# Patient Record
Sex: Male | Born: 1960
Health system: Southern US, Community
[De-identification: ages and names within clinical notes are randomized; demographics above are authoritative.]

## PROBLEM LIST (undated history)

## (undated) DIAGNOSIS — K219 Gastro-esophageal reflux disease without esophagitis: Secondary | ICD-10-CM

## (undated) DIAGNOSIS — E785 Hyperlipidemia, unspecified: Secondary | ICD-10-CM

## (undated) DIAGNOSIS — I10 Essential (primary) hypertension: Secondary | ICD-10-CM

## (undated) DIAGNOSIS — T7840XA Allergy, unspecified, initial encounter: Secondary | ICD-10-CM

## (undated) HISTORY — DX: Essential (primary) hypertension: I10

## (undated) HISTORY — DX: Allergy, unspecified, initial encounter: T78.40XA

## (undated) HISTORY — DX: Gastro-esophageal reflux disease without esophagitis: K21.9

## (undated) HISTORY — DX: Hyperlipidemia, unspecified: E78.5

## (undated) HISTORY — PX: VASECTOMY: SHX75

---

## 2001-07-08 ENCOUNTER — Emergency Department (HOSPITAL_COMMUNITY): Admission: EM | Admit: 2001-07-08 | Discharge: 2001-07-08 | Payer: Self-pay | Admitting: Podiatry

## 2004-11-26 ENCOUNTER — Ambulatory Visit (HOSPITAL_COMMUNITY): Admission: RE | Admit: 2004-11-26 | Discharge: 2004-11-26 | Payer: Self-pay | Admitting: *Deleted

## 2011-06-01 HISTORY — PX: SHOULDER SURGERY: SHX246

## 2011-06-09 ENCOUNTER — Ambulatory Visit (INDEPENDENT_AMBULATORY_CARE_PROVIDER_SITE_OTHER): Payer: Federal, State, Local not specified - PPO

## 2011-06-09 DIAGNOSIS — J019 Acute sinusitis, unspecified: Secondary | ICD-10-CM

## 2011-06-16 ENCOUNTER — Ambulatory Visit (INDEPENDENT_AMBULATORY_CARE_PROVIDER_SITE_OTHER): Payer: Federal, State, Local not specified - PPO

## 2011-06-16 DIAGNOSIS — R197 Diarrhea, unspecified: Secondary | ICD-10-CM

## 2011-09-11 ENCOUNTER — Ambulatory Visit (INDEPENDENT_AMBULATORY_CARE_PROVIDER_SITE_OTHER): Payer: Federal, State, Local not specified - PPO | Admitting: Emergency Medicine

## 2011-09-11 VITALS — BP 154/90 | HR 102 | Temp 98.6°F | Resp 16 | Ht 69.0 in | Wt 234.2 lb

## 2011-09-11 DIAGNOSIS — I10 Essential (primary) hypertension: Secondary | ICD-10-CM

## 2011-09-11 DIAGNOSIS — R6889 Other general symptoms and signs: Secondary | ICD-10-CM

## 2011-09-11 LAB — BASIC METABOLIC PANEL
BUN: 15 mg/dL (ref 6–23)
CO2: 29 mEq/L (ref 19–32)
Calcium: 9.9 mg/dL (ref 8.4–10.5)
Chloride: 101 mEq/L (ref 96–112)
Creat: 0.94 mg/dL (ref 0.50–1.35)
Glucose, Bld: 92 mg/dL (ref 70–99)
Potassium: 4 mEq/L (ref 3.5–5.3)
Sodium: 140 mEq/L (ref 135–145)

## 2011-09-11 LAB — POCT CBC
Granulocyte percent: 65.6 %G (ref 37–80)
HCT, POC: 44.8 % (ref 43.5–53.7)
Hemoglobin: 14.9 g/dL (ref 14.1–18.1)
Lymph, poc: 3.4 (ref 0.6–3.4)
MCH, POC: 26.8 pg — AB (ref 27–31.2)
MCHC: 33.3 g/dL (ref 31.8–35.4)
MCV: 80.8 fL (ref 80–97)
MID (cbc): 0.6 (ref 0–0.9)
MPV: 9.9 fL (ref 0–99.8)
POC Granulocyte: 7.5 — AB (ref 2–6.9)
POC LYMPH PERCENT: 29.3 %L (ref 10–50)
POC MID %: 5.1 %M (ref 0–12)
Platelet Count, POC: 260 10*3/uL (ref 142–424)
RBC: 5.55 M/uL (ref 4.69–6.13)
RDW, POC: 13.2 %
WBC: 11.5 10*3/uL — AB (ref 4.6–10.2)

## 2011-09-11 MED ORDER — LISINOPRIL 20 MG PO TABS: ORAL_TABLET | ORAL | Status: DC

## 2011-09-11 NOTE — Progress Notes (Signed)
  Subjective:    Patient ID: Drew Miles, male    DOB: 1960/09/06, 51 y.o.   MRN: [id number]  HPI patient enters for recheck blood pressure. He called me last night stating that his systolic blood pressure down to 170. He was advised to double the dose of his lisinopril. He denies any chest pain or shortness of breath. He had shoulder surgery earlier in the week and had a CT abdomen and pelvis done earlier in the week. A CT abdomen and pelvis showed a renal cyst. He scan also showed some sub-centimeters lymph nodes around the pancreas.    Review of Systems this systems is positive for recent surgery with Dr. supple on his left shoulder     Objective:   Physical Exam his shoulders and spine. If taken in the right arm with the patient seated is 150/100. His chest was clear his heart regular rate no murmurs.        Assessment & Plan:  Assessment and his recent worsening blood pressure. He did recently have a CT scan with contrast so needed check his renal function today. In the interim we'll plan on increasing his lisinopril to a total of 40 mg a day. Patient also had an abnormal CT of the pancreas kidneys. He was found to have subcentimeter nodes on CT. I would recommend we repeat a scan in 6 months. Patient and wife are both aware of this.

## 2011-09-14 ENCOUNTER — Encounter: Payer: Self-pay | Admitting: *Deleted

## 2011-11-29 NOTE — Progress Notes (Signed)
Completed prior auth for Nexium 40 mg and received approval over the phone from 09/29/11- 11/28/12. Faxed approval notification to pharmacy.

## 2011-12-21 ENCOUNTER — Encounter: Payer: Self-pay | Admitting: Emergency Medicine

## 2011-12-31 ENCOUNTER — Other Ambulatory Visit: Payer: Self-pay | Admitting: *Deleted

## 2011-12-31 ENCOUNTER — Telehealth: Payer: Self-pay | Admitting: *Deleted

## 2011-12-31 ENCOUNTER — Encounter: Payer: Self-pay | Admitting: Emergency Medicine

## 2011-12-31 MED ORDER — ESOMEPRAZOLE MAGNESIUM 40 MG PO CPDR: 40.0000 mg | DELAYED_RELEASE_CAPSULE | Freq: Every day | ORAL | Status: DC

## 2011-12-31 MED ORDER — ATORVASTATIN CALCIUM 40 MG PO TABS: 40.0000 mg | ORAL_TABLET | Freq: Every day | ORAL | Status: DC

## 2011-12-31 NOTE — Telephone Encounter (Signed)
rx sent in 

## 2012-02-01 ENCOUNTER — Encounter: Payer: Self-pay | Admitting: Emergency Medicine

## 2012-02-01 ENCOUNTER — Ambulatory Visit (INDEPENDENT_AMBULATORY_CARE_PROVIDER_SITE_OTHER): Payer: Federal, State, Local not specified - PPO | Admitting: Emergency Medicine

## 2012-02-01 VITALS — BP 110/68 | HR 70 | Temp 97.9°F | Resp 16

## 2012-02-01 DIAGNOSIS — Z0000 Encounter for general adult medical examination without abnormal findings: Secondary | ICD-10-CM

## 2012-02-01 DIAGNOSIS — E785 Hyperlipidemia, unspecified: Secondary | ICD-10-CM

## 2012-02-01 DIAGNOSIS — I10 Essential (primary) hypertension: Secondary | ICD-10-CM | POA: Insufficient documentation

## 2012-02-01 DIAGNOSIS — IMO0001 Reserved for inherently not codable concepts without codable children: Secondary | ICD-10-CM | POA: Insufficient documentation

## 2012-02-01 DIAGNOSIS — R9389 Abnormal findings on diagnostic imaging of other specified body structures: Secondary | ICD-10-CM

## 2012-02-01 DIAGNOSIS — Z139 Encounter for screening, unspecified: Secondary | ICD-10-CM

## 2012-02-01 LAB — POCT UA - MICROSCOPIC ONLY
Bacteria, U Microscopic: NEGATIVE
Casts, Ur, LPF, POC: NEGATIVE
Crystals, Ur, HPF, POC: NEGATIVE
Yeast, UA: NEGATIVE

## 2012-02-01 LAB — CBC WITH DIFFERENTIAL/PLATELET
Basophils Absolute: 0 10*3/uL (ref 0.0–0.1)
Basophils Relative: 0 % (ref 0–1)
Eosinophils Absolute: 0.1 10*3/uL (ref 0.0–0.7)
Eosinophils Relative: 1 % (ref 0–5)
HCT: 41.5 % (ref 39.0–52.0)
Hemoglobin: 14.3 g/dL (ref 13.0–17.0)
Lymphocytes Relative: 35 % (ref 12–46)
Lymphs Abs: 3.5 10*3/uL (ref 0.7–4.0)
MCH: 26.8 pg (ref 26.0–34.0)
MCHC: 34.5 g/dL (ref 30.0–36.0)
MCV: 77.9 fL — ABNORMAL LOW (ref 78.0–100.0)
Monocytes Absolute: 0.6 10*3/uL (ref 0.1–1.0)
Monocytes Relative: 6 % (ref 3–12)
Neutro Abs: 5.8 10*3/uL (ref 1.7–7.7)
Neutrophils Relative %: 58 % (ref 43–77)
Platelets: 228 10*3/uL (ref 150–400)
RBC: 5.33 MIL/uL (ref 4.22–5.81)
RDW: 13.4 % (ref 11.5–15.5)
WBC: 10 10*3/uL (ref 4.0–10.5)

## 2012-02-01 LAB — POCT URINALYSIS DIPSTICK
Bilirubin, UA: NEGATIVE
Glucose, UA: NEGATIVE
Ketones, UA: NEGATIVE
Leukocytes, UA: NEGATIVE
Nitrite, UA: NEGATIVE
Protein, UA: NEGATIVE
Spec Grav, UA: 1.025
Urobilinogen, UA: 0.2
pH, UA: 6

## 2012-02-01 LAB — IFOBT (OCCULT BLOOD): IFOBT: NEGATIVE

## 2012-02-01 MED ORDER — LISINOPRIL 20 MG PO TABS: ORAL_TABLET | ORAL | Status: DC

## 2012-02-01 MED ORDER — ATORVASTATIN CALCIUM 40 MG PO TABS: 40.0000 mg | ORAL_TABLET | Freq: Every day | ORAL | Status: DC

## 2012-02-01 MED ORDER — ESOMEPRAZOLE MAGNESIUM 40 MG PO CPDR: 40.0000 mg | DELAYED_RELEASE_CAPSULE | Freq: Every day | ORAL | Status: DC

## 2012-02-01 NOTE — Progress Notes (Signed)
@[email or web address] @  Patient ID: Drew Miles MRN: [id number], DOB: 04-18-1961 51 y.o. Date of Encounter: 02/01/2012, 4:44 PM  Primary Physician: Lucilla Edin, MD  Chief Complaint: Physical (CPE)  HPI: 51 y.o. y/o male with history noted below here for CPE.  Doing well. No issues/complaints.  Review of Systems:  Consitutional: No fever, chills, fatigue, night sweats, lymphadenopathy, or weight changes. Eyes: No visual changes, eye redness, or discharge. ENT/Mouth: Ears: No otalgia, tinnitus, hearing loss, discharge. Nose: No congestion, rhinorrhea, sinus pain, or epistaxis. Throat: No sore throat, post nasal drip, or teeth pain. Cardiovascular: No CP, palpitations, diaphoresis, DOE, edema, orthopnea, PND. Respiratory: No cough, hemoptysis, SOB, or wheezing. Gastrointestinal: No anorexia, dysphagia, reflux, pain, nausea, vomiting, hematemesis, diarrhea, constipation, BRBPR, or melena. Genitourinary: No dysuria, frequency, urgency, hematuria, incontinence, nocturia, decreased urinary stream, discharge, impotence, or testicular pain/masses patient was found to have some. Pancreatic nodes on a CT of the abdomen which was done in April. He has not had followup on this yet.. Musculoskeletal: No decreased ROM, myalgias, stiffness, joint swelling, or weakness. Skin: No rash, erythema, lesion changes, pain, warmth, jaundice, or pruritis. Neurological: No headache, dizziness, syncope, seizures, tremors, memory loss, coordination problems, or paresthesias. Psychological: No anxiety, depression, hallucinations, SI/HI. Endocrine: No fatigue, polydipsia, polyphagia, polyuria, or known diabetes. All other systems were reviewed and are otherwise negative.  No past medical history on file.   No past surgical history on file.  Home Meds:  Prior to Admission medications   Medication Sig Start Date End Date Taking? Authorizing Provider  aspirin EC 81 MG tablet Take 81 mg by mouth daily.   Yes Historical  Provider, MD  atorvastatin (LIPITOR) 40 MG tablet Take 1 tablet (40 mg total) by mouth daily. 12/31/11  Yes Heather Jaquita Rector, PA-C  Cholecalciferol (VITAMIN D) 400 UNITS capsule Take 400 Units by mouth daily.   Yes Historical Provider, MD  esomeprazole (NEXIUM) 40 MG capsule Take 1 capsule (40 mg total) by mouth daily before breakfast. 12/31/11  Yes Heather M Marte, PA-C  lisinopril (PRINIVIL,ZESTRIL) 20 MG tablet Take one tablet in the morning and one half to one tablet at night for blood pressure 09/11/11  Yes [name], MD  Multiple Vitamin (MULTIVITAMIN) tablet Take 1 tablet by mouth daily.   Yes Historical Provider, MD    Allergies:  Allergies  Allergen Reactions  . Anesthetics, Ester Other (See Comments)    INCREASED BP    History   Social History  . Marital Status: Married    Spouse Name: N/A    Number of Children: N/A  . Years of Education: N/A   Occupational History  . Not on file.   Social History Main Topics  . Smoking status: Former Smoker -- 5 years    Types: Cigarettes    Quit date: 05/31/1980  . Smokeless tobacco: Not on file  . Alcohol Use: No  . Drug Use: Not on file  . Sexually Active: Not on file   Other Topics Concern  . Not on file   Social History Narrative  . No narrative on file    No family history on file.  Physical Exam:  Blood pressure 110/68, pulse 70, temperature 97.9 F (36.6 C), temperature source Oral, resp. rate 16.  General: Well developed, well nourished, in no acute distress. HEENT: Normocephalic, atraumatic. Conjunctiva pink, sclera non-icteric. Pupils 2 mm constricting to 1 mm, round, regular, and equally reactive to light and accomodation. EOMI. Internal auditory canal clear. TMs with good cone  of light and without pathology. Nasal mucosa pink. Nares are without discharge. No sinus tenderness. Oral mucosa pink. Dentition . Pharynx without exudate.   Neck: Supple. Trachea midline. No thyromegaly. Full ROM. No  lymphadenopathy. Lungs: Clear to auscultation bilaterally without wheezes, rales, or rhonchi. Breathing is of normal effort and unlabored. Cardiovascular: RRR with S1 S2. No murmurs, rubs, or gallops appreciated. Distal pulses 2+ symmetrically. No carotid or abdominal bruits.  Abdomen: Soft, non-tender, non-distended with normoactive bowel sounds. No hepatosplenomegaly or masses. No rebound/guarding. No CVA tenderness. Without hernias.  Rectal: No external hemorrhoids or fissures. Rectal vault without masses.   Genitourinary:  circumcised male. No penile lesions. Testes descended bilaterally, and smooth without tenderness or masses.  Musculoskeletal: Full range of motion and 5/5 strength throughout. Without swelling, atrophy, tenderness, crepitus, or warmth. Extremities without clubbing, cyanosis, or edema. Calves supple. Skin: Warm and moist without erythema, ecchymosis, wounds, or rash. Neuro: A+Ox3. CN II-XII grossly intact. Moves all extremities spontaneously. Full sensation throughout. Normal gait. DTR 2+ throughout upper and lower extremities. Finger to nose intact. Psych:  Responds to questions appropriately with a normal affect.   Studies: CBC, CMET, Lipid, PSA, TSH,  all pending. Patient is stable on current medications. He was requesting a celiac panel because of his son being diagnosed with celiac disease  UA:  Results for orders placed in visit on 02/01/12  POCT URINALYSIS DIPSTICK      Component Value Range   Color, UA yellow     Clarity, UA clear     Glucose, UA neg     Bilirubin, UA neg     Ketones, UA neg     Spec Grav, UA 1.025     Blood, UA trace     pH, UA 6.0     Protein, UA neg     Urobilinogen, UA 0.2     Nitrite, UA neg     Leukocytes, UA Negative    POCT UA - MICROSCOPIC ONLY      Component Value Range   WBC, Ur, HPF, POC 0-1     RBC, urine, microscopic 0-4     Bacteria, U Microscopic neg     Mucus, UA trace     Epithelial cells, urine per micros 0-1      Crystals, Ur, HPF, POC neg     Casts, Ur, LPF, POC neg     Yeast, UA neg    IFOBT (OCCULT BLOOD)      Component Value Range   IFOBT Negative      Assessment/Plan:  51 y.o. y/o   male here for CPE Patient is doing well at the present time. I did order a repeat CT scan to followup on some peripancreatic nodes noted on the CT done in April when he saw the urologist. I also did screening for celiac disease since his son tested positive for that disorder -  Signed, Earl Lites, MD 02/01/2012 4:44 PM

## 2012-02-02 ENCOUNTER — Encounter: Payer: Self-pay | Admitting: *Deleted

## 2012-02-02 LAB — LIPID PANEL
Cholesterol: 161 mg/dL (ref 0–200)
HDL: 29 mg/dL — ABNORMAL LOW (ref >39)
LDL Cholesterol: 92 mg/dL (ref 0–99)
Total CHOL/HDL Ratio: 5.6 Ratio
Triglycerides: 202 mg/dL — ABNORMAL HIGH (ref <150)
VLDL: 40 mg/dL (ref 0–40)

## 2012-02-02 LAB — GLIA (IGA/G) + TTG IGA
Gliadin IgA: 2.4 U/mL (ref <20)
Gliadin IgG: 3.9 U/mL (ref <20)
Tissue Transglutaminase Ab, IgA: 2 U/mL (ref <20)

## 2012-02-02 LAB — PSA: PSA: 0.29 ng/mL (ref <=4.00)

## 2012-02-02 LAB — COMPREHENSIVE METABOLIC PANEL
ALT: 20 U/L (ref 0–53)
AST: 19 U/L (ref 0–37)
Albumin: 4.4 g/dL (ref 3.5–5.2)
Alkaline Phosphatase: 91 U/L (ref 39–117)
BUN: 12 mg/dL (ref 6–23)
CO2: 29 mEq/L (ref 19–32)
Calcium: 9.3 mg/dL (ref 8.4–10.5)
Chloride: 103 mEq/L (ref 96–112)
Creat: 0.95 mg/dL (ref 0.50–1.35)
Glucose, Bld: 89 mg/dL (ref 70–99)
Potassium: 4.1 mEq/L (ref 3.5–5.3)
Sodium: 141 mEq/L (ref 135–145)
Total Bilirubin: 0.7 mg/dL (ref 0.3–1.2)
Total Protein: 6.5 g/dL (ref 6.0–8.3)

## 2012-02-02 LAB — TSH: TSH: 0.921 u[IU]/mL (ref 0.350–4.500)

## 2012-02-04 NOTE — Addendum Note (Signed)
Addended by: Cydney Ok on: 02/04/2012 11:05 AM   Modules accepted: Orders

## 2012-02-06 ENCOUNTER — Other Ambulatory Visit: Payer: Self-pay | Admitting: Physician Assistant

## 2012-02-07 ENCOUNTER — Ambulatory Visit
Admission: RE | Admit: 2012-02-07 | Discharge: 2012-02-07 | Disposition: A | Discharge status: Home / Self Care | Payer: Federal, State, Local not specified - PPO | Source: Ambulatory Visit | Attending: Emergency Medicine | Admitting: Emergency Medicine

## 2012-02-07 DIAGNOSIS — R9389 Abnormal findings on diagnostic imaging of other specified body structures: Secondary | ICD-10-CM

## 2012-02-07 DIAGNOSIS — E785 Hyperlipidemia, unspecified: Secondary | ICD-10-CM

## 2012-02-07 MED ORDER — IOHEXOL 300 MG/ML  SOLN
100.0000 mL | Freq: Once | INTRAMUSCULAR | Status: AC | PRN
  Administered 2012-02-07: 100 mL via INTRAVENOUS

## 2012-02-09 ENCOUNTER — Other Ambulatory Visit: Payer: Self-pay

## 2012-02-25 ENCOUNTER — Ambulatory Visit (INDEPENDENT_AMBULATORY_CARE_PROVIDER_SITE_OTHER): Payer: Federal, State, Local not specified - PPO

## 2012-02-25 DIAGNOSIS — Z23 Encounter for immunization: Secondary | ICD-10-CM

## 2012-11-22 ENCOUNTER — Telehealth: Payer: Self-pay

## 2012-11-22 NOTE — Telephone Encounter (Signed)
Pt is calling to see if when he comes to get his annual pe would there be a PSA done. He has been going to a urologist and they were wanting to do a PSA but if he would have one at his annual pe then he wouldn't do the one at the urologist If someone could give him a call back he would like it  Call back number is [phone]

## 2012-11-22 NOTE — Telephone Encounter (Signed)
Yes, we can do this. 

## 2012-11-22 NOTE — Telephone Encounter (Signed)
I have advised him we can do this. To you FYI

## 2012-12-18 ENCOUNTER — Ambulatory Visit: Payer: Federal, State, Local not specified - PPO

## 2012-12-18 ENCOUNTER — Ambulatory Visit (INDEPENDENT_AMBULATORY_CARE_PROVIDER_SITE_OTHER): Payer: Federal, State, Local not specified - PPO | Admitting: Emergency Medicine

## 2012-12-18 VITALS — BP 134/82 | HR 94 | Temp 98.2°F | Resp 16 | Ht 69.25 in | Wt 234.4 lb

## 2012-12-18 DIAGNOSIS — R3915 Urgency of urination: Secondary | ICD-10-CM

## 2012-12-18 DIAGNOSIS — R1084 Generalized abdominal pain: Secondary | ICD-10-CM

## 2012-12-18 DIAGNOSIS — R300 Dysuria: Secondary | ICD-10-CM

## 2012-12-18 LAB — POCT CBC
Granulocyte percent: 63 %G (ref 37–80)
HCT, POC: 46.2 % (ref 43.5–53.7)
Hemoglobin: 14.8 g/dL (ref 14.1–18.1)
Lymph, poc: 2.7 (ref 0.6–3.4)
MCH, POC: 27.2 pg (ref 27–31.2)
MCHC: 32 g/dL (ref 31.8–35.4)
MCV: 84.7 fL (ref 80–97)
MID (cbc): 0.4 (ref 0–0.9)
MPV: 9.9 fL (ref 0–99.8)
POC Granulocyte: 5.3 (ref 2–6.9)
POC LYMPH PERCENT: 32.2 %L (ref 10–50)
POC MID %: 4.8 %M (ref 0–12)
Platelet Count, POC: 219 10*3/uL (ref 142–424)
RBC: 5.45 M/uL (ref 4.69–6.13)
RDW, POC: 13 %
WBC: 8.4 10*3/uL (ref 4.6–10.2)

## 2012-12-18 LAB — POCT URINALYSIS DIPSTICK
Bilirubin, UA: NEGATIVE
Glucose, UA: NEGATIVE
Ketones, UA: NEGATIVE
Leukocytes, UA: NEGATIVE
Nitrite, UA: NEGATIVE
Protein, UA: NEGATIVE
Spec Grav, UA: 1.015
Urobilinogen, UA: 0.2
pH, UA: 6.5

## 2012-12-18 LAB — POCT UA - MICROSCOPIC ONLY
Bacteria, U Microscopic: NEGATIVE
Casts, Ur, LPF, POC: NEGATIVE
Crystals, Ur, HPF, POC: NEGATIVE
Mucus, UA: NEGATIVE
WBC, Ur, HPF, POC: NEGATIVE
Yeast, UA: NEGATIVE

## 2012-12-18 LAB — PSA: PSA: 0.54 ng/mL (ref <=4.00)

## 2012-12-18 NOTE — Progress Notes (Signed)
  Subjective:    Patient ID: Drew Miles, male    DOB: November 03, 1960, 52 y.o.   MRN: [id number]  HPI 52 year old male presents with  Burning with urination especially after urinating, pressure, not more frequent urinating x 2 days More tired- napped yesterday which is unusual No nausea, vomiting, diarrhea No fullness or soreness in rectum Good urinary stream history of a kidney stone- while in college No history of diverticulitis   Review of Systems     Objective:   Physical Exam chest is clear to auscultation. There is no flank pain. The abdomen is not distended. There is tenderness in the suprapubic area and lower abdomen. [facility] reading (PRIMARY) by  Dr.Carrick Rijos KUB does not disclose a stone.  Results for orders placed in visit on 12/18/12  POCT UA - MICROSCOPIC ONLY      Result Value Range   WBC, Ur, HPF, POC neg     RBC, urine, microscopic 0-2     Bacteria, U Microscopic neg     Mucus, UA neg     Epithelial cells, urine per micros 0-1     Crystals, Ur, HPF, POC neg     Casts, Ur, LPF, POC neg     Yeast, UA neg    POCT URINALYSIS DIPSTICK      Result Value Range   Color, UA yellow     Clarity, UA clear     Glucose, UA neg     Bilirubin, UA neg     Ketones, UA neg     Spec Grav, UA 1.015     Blood, UA trace-intact     pH, UA 6.5     Protein, UA neg     Urobilinogen, UA 0.2     Nitrite, UA neg     Leukocytes, UA Negative    POCT CBC      Result Value Range   WBC 8.4  4.6 - 10.2 K/uL   Lymph, poc 2.7  0.6 - 3.4   POC LYMPH PERCENT 32.2  10 - 50 %L   MID (cbc) 0.4  0 - 0.9   POC MID % 4.8  0 - 12 %M   POC Granulocyte 5.3  2 - 6.9   Granulocyte percent 63.0  37 - 80 %G   RBC 5.45  4.69 - 6.13 M/uL   Hemoglobin 14.8  14.1 - 18.1 g/dL   HCT, POC 40.9  81.1 - 53.7 %   MCV 84.7  80 - 97 fL   MCH, POC 27.2  27 - 31.2 pg   MCHC 32.0  31.8 - 35.4 g/dL   RDW, POC 91.4     Platelet Count, POC 219  142 - 424 K/uL   MPV 9.9  0 - 99.8 fL         Assessment & Plan:   Patient will strain his urine. He is placed on Flomax and doxycycline. If he continues to have symptoms we'll need a CT urogram.

## 2012-12-19 LAB — URINE CULTURE
Colony Count: NO GROWTH
Organism ID, Bacteria: NO GROWTH

## 2012-12-24 ENCOUNTER — Ambulatory Visit (INDEPENDENT_AMBULATORY_CARE_PROVIDER_SITE_OTHER): Payer: Federal, State, Local not specified - PPO | Admitting: Emergency Medicine

## 2012-12-24 VITALS — BP 126/90 | HR 74 | Temp 98.0°F | Resp 16 | Ht 69.5 in | Wt 234.6 lb

## 2012-12-24 DIAGNOSIS — R300 Dysuria: Secondary | ICD-10-CM

## 2012-12-24 LAB — POCT URINALYSIS DIPSTICK
Bilirubin, UA: NEGATIVE
Glucose, UA: NEGATIVE
Ketones, UA: NEGATIVE
Leukocytes, UA: NEGATIVE
Nitrite, UA: NEGATIVE
Protein, UA: NEGATIVE
Spec Grav, UA: 1.015
Urobilinogen, UA: 0.2
pH, UA: 6.5

## 2012-12-24 LAB — POCT UA - MICROSCOPIC ONLY
Bacteria, U Microscopic: NEGATIVE
Casts, Ur, LPF, POC: NEGATIVE
Crystals, Ur, HPF, POC: NEGATIVE
Epithelial cells, urine per micros: NEGATIVE
Mucus, UA: NEGATIVE
WBC, Ur, HPF, POC: NEGATIVE
Yeast, UA: NEGATIVE

## 2012-12-24 LAB — POCT CBC
Granulocyte percent: 61.3 %G (ref 37–80)
HCT, POC: 46.1 % (ref 43.5–53.7)
Hemoglobin: 14.7 g/dL (ref 14.1–18.1)
Lymph, poc: 3.2 (ref 0.6–3.4)
MCH, POC: 27 pg (ref 27–31.2)
MCHC: 31.9 g/dL (ref 31.8–35.4)
MCV: 84.7 fL (ref 80–97)
MID (cbc): 0.5 (ref 0–0.9)
MPV: 9.3 fL (ref 0–99.8)
POC Granulocyte: 5.9 (ref 2–6.9)
POC LYMPH PERCENT: 33.8 %L (ref 10–50)
POC MID %: 4.9 %M (ref 0–12)
Platelet Count, POC: 215 10*3/uL (ref 142–424)
RBC: 5.44 M/uL (ref 4.69–6.13)
RDW, POC: 12.7 %
WBC: 9.6 10*3/uL (ref 4.6–10.2)

## 2012-12-24 MED ORDER — CIPROFLOXACIN HCL 500 MG PO TABS: 500.0000 mg | ORAL_TABLET | Freq: Two times a day (BID) | ORAL | Status: DC

## 2012-12-24 NOTE — Progress Notes (Signed)
  Subjective:    Patient ID: Drew Miles, male    DOB: December 09, 1960, 52 y.o.   MRN: [id number]  HPI patient seen earlier in the week. He had complaints of and it urination burning. At that time it was unclear whether this could be a prostatitis versus a urinary tract infection versus a stone. His PSA subsequently returned normal. He has been treated with doxycycline and Flomax and has not noting any improvement. At the end of urination he has a terrible cramping burning sensation which goes along the shaft of the urethra and also involves the suprapubic area. He does not feel ill he has had no fevers chills or other symptoms    Review of Systems     Objective:   Physical Exam abdominal exam reveals tenderness in the suprapubic area. There is mild tenderness in the perineal area but it is not significant. His penis is normal testicles are normal.  Results for orders placed in visit on 12/24/12  POCT URINALYSIS DIPSTICK      Result Value Range   Color, UA yellow     Clarity, UA clear     Glucose, UA neg     Bilirubin, UA neg     Ketones, UA neg     Spec Grav, UA 1.015     Blood, UA trace-lysed     pH, UA 6.5     Protein, UA neg     Urobilinogen, UA 0.2     Nitrite, UA neg     Leukocytes, UA Negative    POCT UA - MICROSCOPIC ONLY      Result Value Range   WBC, Ur, HPF, POC neg     RBC, urine, microscopic 0-1     Bacteria, U Microscopic neg     Mucus, UA neg     Epithelial cells, urine per micros neg     Crystals, Ur, HPF, POC neg     Casts, Ur, LPF, POC neg     Yeast, UA neg    POCT CBC      Result Value Range   WBC 9.6  4.6 - 10.2 K/uL   Lymph, poc 3.2  0.6 - 3.4   POC LYMPH PERCENT 33.8  10 - 50 %L   MID (cbc) 0.5  0 - 0.9   POC MID % 4.9  0 - 12 %M   POC Granulocyte 5.9  2 - 6.9   Granulocyte percent 61.3  37 - 80 %G   RBC 5.44  4.69 - 6.13 M/uL   Hemoglobin 14.7  14.1 - 18.1 g/dL   HCT, POC 78.4  69.6 - 53.7 %   MCV 84.7  80 - 97 fL   MCH, POC 27.0  27 - 31.2 pg   MCHC 31.9  31.8 - 35.4 g/dL   RDW, POC 29.5     Platelet Count, POC 215  142 - 424 K/uL   MPV 9.3  0 - 99.8 fL        Assessment & Plan:  We'll make an urgent referral to urology for their evaluation. He has been to their office before was last seen by Dr. Margarita Grizzle. We'll change antibiotics to Cipro. I do have concerns as to whether this could be interstitial cystitis

## 2013-01-13 ENCOUNTER — Ambulatory Visit (INDEPENDENT_AMBULATORY_CARE_PROVIDER_SITE_OTHER): Payer: Federal, State, Local not specified - PPO | Admitting: Internal Medicine

## 2013-01-13 VITALS — BP 118/74 | HR 81 | Temp 98.6°F | Resp 18 | Ht 70.5 in | Wt 230.6 lb

## 2013-01-13 DIAGNOSIS — J029 Acute pharyngitis, unspecified: Secondary | ICD-10-CM

## 2013-01-13 NOTE — Progress Notes (Signed)
  Subjective:    Patient ID: Drew Miles, male    DOB: 1960/08/06, 52 y.o.   MRN: [id number]  HPI complaining of persistent low-grade sore throat for more than one week No fever No cough Has taken Allegra without any response/no other allergy symptoms No dysphagia No weight loss or night sweats No lymph node swelling  Currently on doxycycline for prostatitis and symptoms have improved/disappeared-see recent visits Has had some early morning dyspepsia and even vomited one morning History of GERD on Nexium   Review of Systems     Objective:   Physical Exam BP 118/74  Pulse 81  Temp(Src) 98.6 F (37 C) (Oral)  Resp 18  Ht 5' 10.5" (1.791 m)  Wt 230 lb 9.6 oz (104.599 kg)  BMI 32.61 kg/m2  SpO2 97% Conjunctiva clear TMs clear Nares clear Throat slightly injected without exudate No nodes or thyromegaly       Assessment & Plan:  Pharyngitis probably secondary to increased reflux from doxycycline  add Pepcid Complete doxy and discontinue Throat culture done

## 2013-01-15 ENCOUNTER — Encounter: Payer: Self-pay | Admitting: Internal Medicine

## 2013-01-15 LAB — CULTURE, GROUP A STREP: Organism ID, Bacteria: NORMAL

## 2013-01-17 ENCOUNTER — Ambulatory Visit (INDEPENDENT_AMBULATORY_CARE_PROVIDER_SITE_OTHER): Payer: Federal, State, Local not specified - PPO | Admitting: Family Medicine

## 2013-01-17 VITALS — BP 138/84 | HR 73 | Temp 98.3°F | Resp 18 | Ht 69.5 in | Wt 229.0 lb

## 2013-01-17 DIAGNOSIS — R1012 Left upper quadrant pain: Secondary | ICD-10-CM

## 2013-01-17 DIAGNOSIS — R002 Palpitations: Secondary | ICD-10-CM

## 2013-01-17 LAB — POCT UA - MICROSCOPIC ONLY
Bacteria, U Microscopic: NEGATIVE
Casts, Ur, LPF, POC: NEGATIVE
Crystals, Ur, HPF, POC: NEGATIVE
Epithelial cells, urine per micros: NEGATIVE
Mucus, UA: NEGATIVE
WBC, Ur, HPF, POC: NEGATIVE
Yeast, UA: NEGATIVE

## 2013-01-17 LAB — POCT URINALYSIS DIPSTICK
Bilirubin, UA: NEGATIVE
Glucose, UA: NEGATIVE
Ketones, UA: NEGATIVE
Leukocytes, UA: NEGATIVE
Nitrite, UA: NEGATIVE
Protein, UA: NEGATIVE
Spec Grav, UA: 1.01
Urobilinogen, UA: 0.2
pH, UA: 6.5

## 2013-01-17 NOTE — Patient Instructions (Signed)
Thank you for coming in today  I do not think that your pain is anything serious from your abdomen or chest Your urine only shows blood Your EKG is completely normal We will get labs today Keep symptom journal Followup with PCP at physical  Abdominal Pain Abdominal pain can be caused by many things. Your caregiver decides the seriousness of your pain by an examination and possibly blood tests and X-rays. Many cases can be observed and treated at home. Most abdominal pain is not caused by a disease and will probably improve without treatment. However, in many cases, more time must pass before a clear cause of the pain can be found. Before that point, it may not be known if you need more testing, or if hospitalization or surgery is needed. HOME CARE INSTRUCTIONS   Do not take laxatives unless directed by your caregiver.  Take pain medicine only as directed by your caregiver.  Only take over-the-counter or prescription medicines for pain, discomfort, or fever as directed by your caregiver.  Try a clear liquid diet (broth, tea, or water) for as long as directed by your caregiver. Slowly move to a bland diet as tolerated. SEEK IMMEDIATE MEDICAL CARE IF:   The pain does not go away.  You have a fever.  You keep throwing up (vomiting).  The pain is felt only in portions of the abdomen. Pain in the right side could possibly be appendicitis. In an adult, pain in the left lower portion of the abdomen could be colitis or diverticulitis.  You pass bloody or black tarry stools. MAKE SURE YOU:   Understand these instructions.  Will watch your condition.  Will get help right away if you are not doing well or get worse. Document Released: 02/24/2005 Document Revised: 08/09/2011 Document Reviewed: 01/03/2008 [facility] Patient Information 2014 [place].

## 2013-01-17 NOTE — Progress Notes (Signed)
  Subjective:    Patient ID: Drew Miles, male    DOB: 03/20/1961, 52 y.o.   MRN: [id number]  HPI Patient presents with pain in multiple areas. Pain comes and goes. Pain on left side of thorax, sometimes epigastric, sometimes LLQ. Pain started on Monday. Worsened pain with sitting up. Sharp pain that lasts for a few seconds and then resolves on its own. No trigger known. Nothing makes it better or worse. No association with food. Does have GERD but this seems different. Started taking zantac in addition to nexium over the weekend at recommendation of physician. Normal BMs, but they are hard. Does have to strain. BMs very regular. No history of abdominal. Nausea now resolved. No melena or hematochezia. Last colonoscopy 3 years ago was normal. Wife states that patient is moaning at night. No bloating. No blood in urine or difficulty urinating. Was recently on antibiotic, no diarrhea. No fevers. He sometimes has pain with deep breath. No cough, fever, SOB.  Patient also mentions in passing that he is have some sensations that his heart is fluttering.  He has recently been seen and treated for prostatitis. He states that his urinary symptoms have resolved. Stopped flomax.  Review of Systems  Cardiovascular: Positive for chest pain and palpitations.  Gastrointestinal: Negative for abdominal distention.  All other systems reviewed and are negative.       Objective:   Physical Exam  Constitutional: He is oriented to person, place, and time. He appears well-developed and well-nourished. No distress.  HENT:  Head: Normocephalic and atraumatic.  Eyes: Conjunctivae and EOM are normal.  Neck: Normal range of motion.  Cardiovascular: Normal rate, regular rhythm and normal heart sounds.   Pulmonary/Chest: No respiratory distress. He has no wheezes. He has no rales.  Abdominal: Soft. He exhibits no distension. There is no tenderness. There is no rebound and no guarding.  Neurological: He is alert and  oriented to person, place, and time.  Skin: Skin is warm and dry.  Psychiatric: He has a normal mood and affect. His behavior is normal.      Assessment & Plan:  #1. Abdominal pain. Patient denies hematuria, dysuria. UA shows moderate blood which is likely from resolving prostatitis. Pain is localized to left side. No association with food so less likely PUD/gastritis or cholelithiasis. Paitent well appearing so doubt pancreatitis. Recent eval by urologist without concern for stones. EKG done here shows normal sinus rhythm, non-exertional, atypical so doubt cardiac etiology. Abdominal exam benign, which is reassuring. Will obtain baseline labs, have patient keep symptom journal, f/u with PCP in 1-2 weeks.

## 2013-01-18 LAB — COMPREHENSIVE METABOLIC PANEL
ALT: 18 U/L (ref 0–53)
AST: 16 U/L (ref 0–37)
Albumin: 4.6 g/dL (ref 3.5–5.2)
Alkaline Phosphatase: 73 U/L (ref 39–117)
BUN: 11 mg/dL (ref 6–23)
CO2: 27 mEq/L (ref 19–32)
Calcium: 9.4 mg/dL (ref 8.4–10.5)
Chloride: 104 mEq/L (ref 96–112)
Creat: 0.91 mg/dL (ref 0.50–1.35)
Glucose, Bld: 89 mg/dL (ref 70–99)
Potassium: 3.9 mEq/L (ref 3.5–5.3)
Sodium: 139 mEq/L (ref 135–145)
Total Bilirubin: 0.4 mg/dL (ref 0.3–1.2)
Total Protein: 6.7 g/dL (ref 6.0–8.3)

## 2013-01-18 LAB — CBC WITH DIFFERENTIAL/PLATELET
Basophils Absolute: 0 10*3/uL (ref 0.0–0.1)
Basophils Relative: 0 % (ref 0–1)
Eosinophils Absolute: 0.1 10*3/uL (ref 0.0–0.7)
Eosinophils Relative: 1 % (ref 0–5)
HCT: 42.5 % (ref 39.0–52.0)
Hemoglobin: 14.4 g/dL (ref 13.0–17.0)
Lymphocytes Relative: 35 % (ref 12–46)
Lymphs Abs: 3.7 10*3/uL (ref 0.7–4.0)
MCH: 26.4 pg (ref 26.0–34.0)
MCHC: 33.9 g/dL (ref 30.0–36.0)
MCV: 78 fL (ref 78.0–100.0)
Monocytes Absolute: 0.7 10*3/uL (ref 0.1–1.0)
Monocytes Relative: 7 % (ref 3–12)
Neutro Abs: 6 10*3/uL (ref 1.7–7.7)
Neutrophils Relative %: 57 % (ref 43–77)
Platelets: 221 10*3/uL (ref 150–400)
RBC: 5.45 MIL/uL (ref 4.22–5.81)
RDW: 14.2 % (ref 11.5–15.5)
WBC: 10.5 10*3/uL (ref 4.0–10.5)

## 2013-01-18 LAB — LIPASE: Lipase: 23 U/L (ref 0–75)

## 2013-01-23 ENCOUNTER — Encounter: Payer: Self-pay | Admitting: Family Medicine

## 2013-01-23 NOTE — Progress Notes (Addendum)
History and physical exam reviewed in detail with Dr. Neomia Dear.  EKG reviewed.  Agree with a/p.

## 2013-01-30 ENCOUNTER — Other Ambulatory Visit: Payer: Self-pay | Admitting: Emergency Medicine

## 2013-02-06 ENCOUNTER — Ambulatory Visit (INDEPENDENT_AMBULATORY_CARE_PROVIDER_SITE_OTHER): Payer: Federal, State, Local not specified - PPO | Admitting: Emergency Medicine

## 2013-02-06 ENCOUNTER — Ambulatory Visit: Payer: Federal, State, Local not specified - PPO

## 2013-02-06 ENCOUNTER — Encounter: Payer: Self-pay | Admitting: Emergency Medicine

## 2013-02-06 VITALS — BP 126/72 | HR 68 | Temp 98.8°F | Resp 16 | Ht 69.5 in | Wt 230.8 lb

## 2013-02-06 DIAGNOSIS — M545 Low back pain, unspecified: Secondary | ICD-10-CM

## 2013-02-06 DIAGNOSIS — E785 Hyperlipidemia, unspecified: Secondary | ICD-10-CM

## 2013-02-06 DIAGNOSIS — I10 Essential (primary) hypertension: Secondary | ICD-10-CM

## 2013-02-06 DIAGNOSIS — Z23 Encounter for immunization: Secondary | ICD-10-CM

## 2013-02-06 DIAGNOSIS — B351 Tinea unguium: Secondary | ICD-10-CM

## 2013-02-06 DIAGNOSIS — E669 Obesity, unspecified: Secondary | ICD-10-CM | POA: Insufficient documentation

## 2013-02-06 DIAGNOSIS — Z0000 Encounter for general adult medical examination without abnormal findings: Secondary | ICD-10-CM

## 2013-02-06 LAB — POCT URINALYSIS DIPSTICK
Bilirubin, UA: NEGATIVE
Glucose, UA: NEGATIVE
Ketones, UA: NEGATIVE
Leukocytes, UA: NEGATIVE
Nitrite, UA: NEGATIVE
Protein, UA: NEGATIVE
Spec Grav, UA: 1.015
Urobilinogen, UA: 0.2
pH, UA: 6.5

## 2013-02-06 LAB — LIPID PANEL
Cholesterol: 165 mg/dL (ref 0–200)
HDL: 36 mg/dL — ABNORMAL LOW (ref >39)
LDL Cholesterol: 71 mg/dL (ref 0–99)
Total CHOL/HDL Ratio: 4.6 Ratio
Triglycerides: 292 mg/dL — ABNORMAL HIGH (ref <150)
VLDL: 58 mg/dL — ABNORMAL HIGH (ref 0–40)

## 2013-02-06 LAB — POCT UA - MICROSCOPIC ONLY
Bacteria, U Microscopic: NEGATIVE
Casts, Ur, LPF, POC: NEGATIVE
Crystals, Ur, HPF, POC: NEGATIVE
WBC, Ur, HPF, POC: NEGATIVE
Yeast, UA: NEGATIVE

## 2013-02-06 MED ORDER — TERBINAFINE HCL 250 MG PO TABS: 250.0000 mg | ORAL_TABLET | Freq: Every day | ORAL | Status: DC

## 2013-02-06 MED ORDER — ROSUVASTATIN CALCIUM 10 MG PO TABS: 10.0000 mg | ORAL_TABLET | Freq: Every day | ORAL | Status: DC

## 2013-02-06 NOTE — Progress Notes (Signed)
  Subjective:    Patient ID: Drew Miles, male    DOB: 11-02-1960, 52 y.o.   MRN: [id number]  HPI    Review of Systems  Constitutional: Negative.   HENT: Negative.   Eyes: Negative.   Respiratory: Negative.   Cardiovascular: Negative.   Gastrointestinal: Negative.   Endocrine: Negative.   Genitourinary: Negative.   Musculoskeletal: Negative.   Skin: Negative.   Allergic/Immunologic: Negative.   Neurological: Negative.   Hematological: Bruises/bleeds easily.  Psychiatric/Behavioral: Negative.    history since seen him for prostatitis. He did have issues with medication intolerance to both Cipro and doxycycline. He developed significant reflux and had blood work at done at that time. Findings were all normal and he improved as he stopped his antibiotics. He does have a urologist Dr. Burman Riis and plans to see him if he continues to have prostate symptoms He is complaining of some pain in his right LS spine area. He has not had frequent episodes of back pain he denies radicular symptoms . He does feel he is having some short-term memory issues related to his Lipitor. These have not been severe but have been mainly recalling phone numbers and common things. He does have some concerns because his father did have dementia.    Objective:   Physical Exam HEENT exam is unremarkable. His neck is supple. Chest was clear. Heart regular rate no murmurs. The abdomen is soft liver and spleen not enlarged there are no areas of tenderness. Rectal exam was deferred since it was done last month. He will be sent he measured be done at home. Extremity exam reveals evidence of onychomycosis involving the great nail on the right. There is tenderness L5-S1 on the right. Deep tendon reflexes are 2+ and symmetrical both legs. Straight leg raising was negative. Motor strength 5 out of 5 all muscle groups [facility] reading (PRIMARY) by  Dr. Cleta Alberts no acute findings seen no fracture seen no bony lytic lesions seen        Assessment & Plan:  Check two-view LS-spine films. He is to see Dr. Georgina Pillion  if he continues to have prostate symptoms. Lipid panel is done today. He does feel he may be having some short-term memory issues we'll switch to Crestor 10 mg one a day. He will be given coupon to help with cost. He is up-to-date on his colonoscopy is up-to-date on tetanus he has had shingles vaccine. He will get his flu shot today . We'll check a comprehensive medical panel in one month

## 2013-02-13 LAB — IFOBT (OCCULT BLOOD): IFOBT: NEGATIVE

## 2013-02-28 ENCOUNTER — Other Ambulatory Visit: Payer: Self-pay | Admitting: Emergency Medicine

## 2013-03-01 NOTE — Progress Notes (Signed)
PA approved for Nexium 40 mg. Notified pharmacy.

## 2013-03-09 ENCOUNTER — Other Ambulatory Visit: Payer: Self-pay | Admitting: Emergency Medicine

## 2013-03-13 ENCOUNTER — Ambulatory Visit (INDEPENDENT_AMBULATORY_CARE_PROVIDER_SITE_OTHER): Payer: Federal, State, Local not specified - PPO | Admitting: Emergency Medicine

## 2013-03-13 DIAGNOSIS — I10 Essential (primary) hypertension: Secondary | ICD-10-CM

## 2013-03-13 DIAGNOSIS — E785 Hyperlipidemia, unspecified: Secondary | ICD-10-CM

## 2013-03-13 DIAGNOSIS — Z79899 Other long term (current) drug therapy: Secondary | ICD-10-CM

## 2013-03-13 DIAGNOSIS — K219 Gastro-esophageal reflux disease without esophagitis: Secondary | ICD-10-CM

## 2013-03-13 LAB — COMPREHENSIVE METABOLIC PANEL
ALT: 16 U/L (ref 0–53)
AST: 15 U/L (ref 0–37)
Albumin: 4.1 g/dL (ref 3.5–5.2)
Alkaline Phosphatase: 69 U/L (ref 39–117)
BUN: 10 mg/dL (ref 6–23)
CO2: 28 mEq/L (ref 19–32)
Calcium: 9 mg/dL (ref 8.4–10.5)
Chloride: 105 mEq/L (ref 96–112)
Creat: 1.02 mg/dL (ref 0.50–1.35)
Glucose, Bld: 97 mg/dL (ref 70–99)
Potassium: 4.1 mEq/L (ref 3.5–5.3)
Sodium: 136 mEq/L (ref 135–145)
Total Bilirubin: 0.2 mg/dL — ABNORMAL LOW (ref 0.3–1.2)
Total Protein: 6.2 g/dL (ref 6.0–8.3)

## 2013-03-13 LAB — LIPID PANEL
Cholesterol: 171 mg/dL (ref 0–200)
HDL: 31 mg/dL — ABNORMAL LOW (ref >39)
LDL Cholesterol: 73 mg/dL (ref 0–99)
Total CHOL/HDL Ratio: 5.5 Ratio
Triglycerides: 336 mg/dL — ABNORMAL HIGH (ref <150)
VLDL: 67 mg/dL — ABNORMAL HIGH (ref 0–40)

## 2013-03-13 MED ORDER — ESOMEPRAZOLE MAGNESIUM 40 MG PO CPDR: DELAYED_RELEASE_CAPSULE | ORAL | Status: DC

## 2013-03-13 MED ORDER — LISINOPRIL 20 MG PO TABS: ORAL_TABLET | ORAL | Status: DC

## 2013-03-13 NOTE — Progress Notes (Signed)
  Subjective:    Patient ID: Drew Miles, male    DOB: Sep 05, 1960, 52 y.o.   MRN: [id number]  HPI followup recent treatment with Lamisil for fungal infection of the nails. He also was changed from Lipitor to Crestor on his last visit. He also needs refills on his medications today    Review of Systems     Objective:   Physical Exam patient is alert cooperative vital signs are as recorded.        Assessment & Plan:  Lipid panel and see him at her done today the cement mainly to check on the status of his Lamisil. We'll also get an idea of the Crestor is working well with his hyperlipidemia.

## 2013-05-01 ENCOUNTER — Telehealth: Payer: Self-pay

## 2013-05-01 NOTE — Telephone Encounter (Signed)
Called and discussed with the patient. She is having yellowish bloody discharge from her nose postnasal drip and cough. She is allergic to penicillin. We'll call in a Z-Pak she has taken this before. She is going to use a nasal spray as a decongestant and continue Mucinex . I will call in the prescription

## 2013-05-01 NOTE — Telephone Encounter (Signed)
PT STATES DR DAUB HAD CALLED HIM A COUPLE OF WEEKS AGO AND HE WOULD JUST WANT TO SPEAK WITH HIM, IT WILL ONLY TAKE A MINUTE OR TWO AND DIDN'T WANT TO DISCUSS WITH ME. PLEASE [phone]

## 2013-05-01 NOTE — Telephone Encounter (Signed)
Called him. To get more information regarding phone call. He states his wife has recently had a knee replacement. Now she has a URI. She has sinus congestion and is beginning to cough. There is no fever. I have advised him to have her start mucinex (plain) and to use flonase. She should come in if she develops productive cough or fever.

## 2013-06-06 ENCOUNTER — Ambulatory Visit (INDEPENDENT_AMBULATORY_CARE_PROVIDER_SITE_OTHER): Payer: Federal, State, Local not specified - PPO | Admitting: Emergency Medicine

## 2013-06-06 VITALS — BP 120/80 | HR 76 | Temp 98.2°F | Resp 16 | Ht 70.0 in | Wt 228.0 lb

## 2013-06-06 DIAGNOSIS — J329 Chronic sinusitis, unspecified: Secondary | ICD-10-CM

## 2013-06-06 DIAGNOSIS — J069 Acute upper respiratory infection, unspecified: Secondary | ICD-10-CM

## 2013-06-06 NOTE — Progress Notes (Signed)
   Subjective:    Patient ID: Drew Miles, male    DOB: November 05, 1960, 53 y.o.   MRN: [id number]  HPI to manage with a one-week history of head congestion and popping in his ear sinus congestion. He denies any purulent nasal drainage he has not had a fever up to he did not feel flulike with aching    Review of Systems     Objective:   Physical Exam HEENT exam is unremarkable except for some dullness of both ears. There is flaccid in the right external auditory canal. Chest was clear to auscultation and percussion        Assessment & Plan:  Patient is given with his sinus congestion. He will continue to use Nasalcrom along with Flonase. He will use his Mucinex D and take one in the morning and one at about 4 PM. He will call me if he develops any purulent nasal drainage or worsening.

## 2013-06-19 ENCOUNTER — Other Ambulatory Visit: Payer: Self-pay | Admitting: Emergency Medicine

## 2013-06-19 DIAGNOSIS — E785 Hyperlipidemia, unspecified: Secondary | ICD-10-CM

## 2013-06-19 MED ORDER — ATORVASTATIN CALCIUM 40 MG PO TABS: 40.0000 mg | ORAL_TABLET | Freq: Every day | ORAL | Status: DC

## 2013-10-15 ENCOUNTER — Ambulatory Visit (INDEPENDENT_AMBULATORY_CARE_PROVIDER_SITE_OTHER): Payer: Federal, State, Local not specified - PPO | Admitting: Internal Medicine

## 2013-10-15 VITALS — BP 132/80 | HR 84 | Temp 98.2°F | Resp 18 | Ht 68.5 in | Wt 228.8 lb

## 2013-10-15 DIAGNOSIS — T6391XA Toxic effect of contact with unspecified venomous animal, accidental (unintentional), initial encounter: Secondary | ICD-10-CM

## 2013-10-15 DIAGNOSIS — T63481A Toxic effect of venom of other arthropod, accidental (unintentional), initial encounter: Secondary | ICD-10-CM

## 2013-10-15 NOTE — Progress Notes (Signed)
   Subjective:    Patient ID: Drew Miles, male    DOB: Jun 01, 1960, 53 y.o.   MRN: [id number]  HPI Chief Complaint  Patient presents with  . Insect Bite    bee sting 1 hour ago and has taken benedryl   swelling has improved but still painful   stung in the front of the neck and had immediate swelling and pain--and has had steady improvement following Benadryl no trouble with breathing or swallowing No history of hypersensitivity  Review of Systems Noncontributory    Objective:   Physical Exam BP 132/80  Pulse 84  Temp(Src) 98.2 F (36.8 C) (Oral)  Resp 18  Ht 5' 8.5" (1.74 m)  Wt 228 lb 12.8 oz (103.783 kg)  BMI 34.28 kg/m2  SpO2 100% HEENT clear except for a small area of redness where the stinger was inserted over his upper thyroid No nodes/no vesiculation or cellulitis      Assessment & Plan:  Insect sting  Continue Benadryl when necessary with ice as needed

## 2013-12-18 ENCOUNTER — Encounter: Payer: Self-pay | Admitting: Emergency Medicine

## 2013-12-18 ENCOUNTER — Ambulatory Visit (INDEPENDENT_AMBULATORY_CARE_PROVIDER_SITE_OTHER): Payer: Federal, State, Local not specified - PPO | Admitting: Emergency Medicine

## 2013-12-18 VITALS — BP 113/78 | HR 74 | Temp 98.3°F | Resp 16 | Ht 69.25 in | Wt 223.4 lb

## 2013-12-18 DIAGNOSIS — Z0000 Encounter for general adult medical examination without abnormal findings: Secondary | ICD-10-CM

## 2013-12-18 DIAGNOSIS — K219 Gastro-esophageal reflux disease without esophagitis: Secondary | ICD-10-CM

## 2013-12-18 DIAGNOSIS — I10 Essential (primary) hypertension: Secondary | ICD-10-CM

## 2013-12-18 DIAGNOSIS — E785 Hyperlipidemia, unspecified: Secondary | ICD-10-CM

## 2013-12-18 LAB — POCT URINALYSIS DIPSTICK
Bilirubin, UA: NEGATIVE
Glucose, UA: NEGATIVE
Ketones, UA: NEGATIVE
Leukocytes, UA: NEGATIVE
Nitrite, UA: NEGATIVE
Protein, UA: NEGATIVE
Spec Grav, UA: 1.015
Urobilinogen, UA: 0.2
pH, UA: 6.5

## 2013-12-18 LAB — CBC WITH DIFFERENTIAL/PLATELET
Basophils Absolute: 0 10*3/uL (ref 0.0–0.1)
Basophils Relative: 0 % (ref 0–1)
Eosinophils Absolute: 0.1 10*3/uL (ref 0.0–0.7)
Eosinophils Relative: 1 % (ref 0–5)
HCT: 43.2 % (ref 39.0–52.0)
Hemoglobin: 14.6 g/dL (ref 13.0–17.0)
Lymphocytes Relative: 33 % (ref 12–46)
Lymphs Abs: 2.6 10*3/uL (ref 0.7–4.0)
MCH: 26.5 pg (ref 26.0–34.0)
MCHC: 33.8 g/dL (ref 30.0–36.0)
MCV: 78.5 fL (ref 78.0–100.0)
Monocytes Absolute: 0.5 10*3/uL (ref 0.1–1.0)
Monocytes Relative: 7 % (ref 3–12)
Neutro Abs: 4.6 10*3/uL (ref 1.7–7.7)
Neutrophils Relative %: 59 % (ref 43–77)
Platelets: 229 10*3/uL (ref 150–400)
RBC: 5.5 MIL/uL (ref 4.22–5.81)
RDW: 14 % (ref 11.5–15.5)
WBC: 7.8 10*3/uL (ref 4.0–10.5)

## 2013-12-18 LAB — LIPID PANEL
Cholesterol: 137 mg/dL (ref 0–200)
HDL: 35 mg/dL — ABNORMAL LOW (ref >39)
LDL Cholesterol: 66 mg/dL (ref 0–99)
Total CHOL/HDL Ratio: 3.9 Ratio
Triglycerides: 179 mg/dL — ABNORMAL HIGH (ref <150)
VLDL: 36 mg/dL (ref 0–40)

## 2013-12-18 LAB — COMPLETE METABOLIC PANEL WITH GFR
ALT: 22 U/L (ref 0–53)
AST: 17 U/L (ref 0–37)
Albumin: 4.4 g/dL (ref 3.5–5.2)
Alkaline Phosphatase: 72 U/L (ref 39–117)
BUN: 12 mg/dL (ref 6–23)
CO2: 26 mEq/L (ref 19–32)
Calcium: 9.2 mg/dL (ref 8.4–10.5)
Chloride: 105 mEq/L (ref 96–112)
Creat: 0.96 mg/dL (ref 0.50–1.35)
GFR, Est African American: >89 mL/min
GFR, Est Non African American: >89 mL/min
Glucose, Bld: 106 mg/dL — ABNORMAL HIGH (ref 70–99)
Potassium: 4.2 mEq/L (ref 3.5–5.3)
Sodium: 141 mEq/L (ref 135–145)
Total Bilirubin: 0.4 mg/dL (ref 0.2–1.2)
Total Protein: 6.3 g/dL (ref 6.0–8.3)

## 2013-12-18 LAB — POCT UA - MICROSCOPIC ONLY
Casts, Ur, LPF, POC: NEGATIVE
Crystals, Ur, HPF, POC: NEGATIVE
Epithelial cells, urine per micros: NEGATIVE
Mucus, UA: NEGATIVE
Yeast, UA: NEGATIVE

## 2013-12-18 LAB — TSH: TSH: 1.374 u[IU]/mL (ref 0.350–4.500)

## 2013-12-18 LAB — IFOBT (OCCULT BLOOD): IFOBT: NEGATIVE

## 2013-12-18 MED ORDER — ESOMEPRAZOLE MAGNESIUM 40 MG PO CPDR: DELAYED_RELEASE_CAPSULE | ORAL | Status: DC

## 2013-12-18 MED ORDER — ATORVASTATIN CALCIUM 40 MG PO TABS: 40.0000 mg | ORAL_TABLET | Freq: Every day | ORAL | Status: DC

## 2013-12-18 MED ORDER — LISINOPRIL 20 MG PO TABS: ORAL_TABLET | ORAL | Status: DC

## 2013-12-18 NOTE — Addendum Note (Signed)
Addended by: Anselm PancoastOBINSON, WANDA R on: 12/18/2013 12:45 PM   Modules accepted: Orders

## 2013-12-18 NOTE — Progress Notes (Signed)
 @[facility] @  Patient ID: Drew Miles MRN: [id number], DOB: [birth year] 53 y.o. Date of Encounter: 12/18/2013, 8:34 AM  Primary Physician: Lucilla EdinAUB, STEVE A, MD  Chief Complaint: Physical (CPE)  HPI: 53 y.o. y/o male with history noted below here for CPE.  Doing well. No issues/complaints.  Review of Systems: Consitutional: No fever, chills, fatigue, night sweats, lymphadenopathy, or weight changes. Eyes: No visual changes, eye redness, or discharge. ENT/Mouth: Ears: No otalgia, tinnitus, hearing loss, discharge. Nose: No congestion, rhinorrhea, sinus pain, or epistaxis. Throat: No sore throat, post nasal drip, or teeth pain. Cardiovascular: He denies chest pain or shortness of breath. He does feel somewhat lightheaded if he tries to get up quickly . Respiratory: No cough, hemoptysis, SOB, or wheezing. Gastrointestinal: No anorexia, dysphagia, reflux, pain, nausea, vomiting, hematemesis, diarrhea, constipation, BRBPR, or melena. Genitourinary: No dysuria, frequency, urgency, hematuria, incontinence, nocturia, decreased urinary stream, discharge, impotence, or testicular pain/masses. Musculoskeletal: No decreased ROM, myalgias, stiffness, joint swelling, or weakness. Skin: No rash, erythema, lesion changes, pain, warmth, jaundice, or pruritis. Neurological: He does describe some mild difficulty with memory. He does have concerns about this because of his history of Lewy body dementia in his mother. Psychological: No anxiety, depression, hallucinations, SI/HI. Endocrine: No fatigue, polydipsia, polyphagia, polyuria, or known diabetes. All other systems were reviewed and are otherwise negative.  Past Medical History  Diagnosis Date  . Hypertension   . Hyperlipidemia   . Allergy      Past Surgical History  Procedure Laterality Date  . Vasectomy    . Shoulder surgery  2013    Home Meds:  Prior to Admission medications   Medication Sig Start Date End Date Taking? Authorizing Provider   aspirin EC 81 MG tablet Take 81 mg by mouth daily.   Yes Historical Provider, MD  atorvastatin (LIPITOR) 40 MG tablet Take 1 tablet (40 mg total) by mouth daily. 06/19/13  Yes Collene GobbleSteven A Hearl Heikes, MD  Cholecalciferol (VITAMIN D) 400 UNITS capsule Take 400 Units by mouth daily.   Yes Historical Provider, MD  esomeprazole (NEXIUM) 40 MG capsule take 1 capsule by mouth every morning before BREAKFAST 03/13/13  Yes Collene GobbleSteven A Geo Slone, MD  fish oil-omega-3 fatty acids 1000 MG capsule Take 2 g by mouth daily.   Yes Historical Provider, MD  lisinopril (PRINIVIL,ZESTRIL) 20 MG tablet take 1 tablet by mouth every morning and 1/2 to 1 tablet by mouth at bedtime for blood pressure 03/13/13  Yes Collene GobbleSteven A Tayvian Holycross, MD  Multiple Vitamin (MULTIVITAMIN) tablet Take 1 tablet by mouth daily.   Yes Historical Provider, MD  b complex vitamins tablet Take 1 tablet by mouth daily.    Historical Provider, MD  Ranitidine HCl (ZANTAC PO) Take by mouth.    Historical Provider, MD  rosuvastatin (CRESTOR) 10 MG tablet Take 1 tablet (10 mg total) by mouth daily. 02/06/13   Collene GobbleSteven A Kenley Troop, MD  terbinafine (LAMISIL) 250 MG tablet Take 1 tablet (250 mg total) by mouth daily. 02/06/13   Collene GobbleSteven A Brandley Aldrete, MD    Allergies:  Allergies  Allergen Reactions  . Anesthetics, Ester Other (See Comments)    INCREASED BP    History   Social History  . Marital Status: Married    Spouse Name: N/A    Number of Children: N/A  . Years of Education: N/A   Occupational History  . Revenue Agent    Social History Main Topics  . Smoking status: Former Smoker -- 5 years    Types: Cigarettes  Quit date: 05/31/1980  . Smokeless tobacco: Never Used  . Alcohol Use: No  . Drug Use: No  . Sexual Activity: Yes   Other Topics Concern  . Not on file   Social History Narrative   Married. Education: [employer]. Exercise: 1 time a week.    Family History  Problem Relation Age of Onset  . Diverticulitis Mother   . Dementia Father   . Epilepsy Son      Physical Exam: Blood pressure 113/78, pulse 74, temperature 98.3 F (36.8 C), temperature source Oral, resp. rate 16, height 5' 9.25" (1.759 m), weight 223 lb 6.4 oz (101.334 kg), SpO2 97.00%.  General: Well developed, well nourished, in no acute distress. HEENT: Normocephalic, atraumatic. Conjunctiva pink, sclera non-icteric. Pupils 2 mm constricting to 1 mm, round, regular, and equally reactive to light and accomodation. EOMI. Internal auditory canal clear. TMs with good cone of light and without pathology. Nasal mucosa pink. Nares are without discharge. No sinus tenderness. Oral mucosa pink. Dentition. Pharynx without exudate.   Neck: Supple. Trachea midline. No thyromegaly. Full ROM. No lymphadenopathy. Lungs: Clear to auscultation bilaterally without wheezes, rales, or rhonchi. Breathing is of normal effort and unlabored. Cardiovascular: RRR with S1 S2. No murmurs, rubs, or gallops appreciated. Distal pulses 2+ symmetrically. No carotid or abdominal bruits. Abdomen: Soft, non-tender, non-distended with normoactive bowel sounds. No hepatosplenomegaly or masses. No rebound/guarding. No CVA tenderness. Without hernias.  Rectal: No external hemorrhoids or fissures. Rectal vault without masses.  Genitourinary:  circumcised male. No penile lesions. Testes descended bilaterally, and smooth without tenderness or masses.  Musculoskeletal: Full range of motion and 5/5 strength throughout. Without swelling, atrophy, tenderness, crepitus, or warmth. Extremities without clubbing, cyanosis, or edema. Calves supple. Skin: Warm and moist without erythema, ecchymosis, wounds, or rash. Neuro: A+Ox3. CN II-XII grossly intact. Moves all extremities spontaneously. Full sensation throughout. Normal gait. DTR 2+ throughout upper and lower extremities. Finger to nose intact. Psych:  Responds to questions appropriately with a normal affect.   Results for orders placed in visit on 12/18/13  IFOBT (OCCULT BLOOD)       Result Value Ref Range   IFOBT Negative    POCT UA - MICROSCOPIC ONLY      Result Value Ref Range   WBC, Ur, HPF, POC 0-1     RBC, urine, microscopic 3-5     Bacteria, U Microscopic trace     Mucus, UA neg     Epithelial cells, urine per micros neg     Crystals, Ur, HPF, POC neg     Casts, Ur, LPF, POC neg     Yeast, UA neg    POCT URINALYSIS DIPSTICK      Result Value Ref Range   Color, UA yellow     Clarity, UA clear     Glucose, UA neg     Bilirubin, UA neg     Ketones, UA neg     Spec Grav, UA 1.015     Blood, UA trace     pH, UA 6.5     Protein, UA neg     Urobilinogen, UA 0.2     Nitrite, UA neg     Leukocytes, UA Negative     Meds ordered this encounter  Medications  . atorvastatin (LIPITOR) 40 MG tablet    Sig: Take 1 tablet (40 mg total) by mouth daily.    Dispense:  90 tablet    Refill:  3  . esomeprazole (NEXIUM) 40 MG capsule  Sig: take 1 capsule by mouth every morning before BREAKFAST    Dispense:  90 capsule    Refill:  3  . lisinopril (PRINIVIL,ZESTRIL) 20 MG tablet    Sig: take 1 tablet by mouth every morning and 1/2 to 1 tablet by mouth at bedtime for blood pressure    Dispense:  180 tablet    Refill:  3   Assessment/Plan:  53 y.o. y/o  male here for CPE. Overall he is doing well. He is up-to-date on immunizations and colonoscopy. Routine labs were done. Meds refilled for one year. He does not want to pursue dementia workup at the present time. I told him to call me if he chose to have some further evaluation. -  Signed, Earl Lites, MD 12/18/2013 8:34 AM

## 2013-12-19 LAB — PSA: PSA: 0.33 ng/mL (ref <=4.00)

## 2014-02-18 ENCOUNTER — Telehealth: Payer: Self-pay | Admitting: *Deleted

## 2014-02-18 NOTE — Telephone Encounter (Signed)
PA for Nexium has been approved 12/16/2013-02/14/2015 Faxed pharmacy.

## 2014-02-25 ENCOUNTER — Ambulatory Visit (INDEPENDENT_AMBULATORY_CARE_PROVIDER_SITE_OTHER): Payer: Federal, State, Local not specified - PPO

## 2014-02-25 DIAGNOSIS — Z23 Encounter for immunization: Secondary | ICD-10-CM

## 2014-04-15 ENCOUNTER — Telehealth: Payer: Self-pay

## 2014-04-15 MED ORDER — OMEPRAZOLE 40 MG PO CPDR: 40.0000 mg | DELAYED_RELEASE_CAPSULE | Freq: Every day | ORAL | Status: DC

## 2014-04-15 NOTE — Telephone Encounter (Signed)
Dr. Cleta Albertsaub,  Suggestions on what to prescribe?

## 2014-04-15 NOTE — Telephone Encounter (Signed)
Sent in and pt notified. 

## 2014-04-15 NOTE — Telephone Encounter (Signed)
Okay to  changes the patient  To omeprazole 40 mg 1 a day #30 refill for 1 year

## 2014-04-15 NOTE — Telephone Encounter (Signed)
PATIENT WOULD LIKE DR. DAUB TO KNOW THAT HIS INSURANCE (BCBS) WILL NO LONGER COVER HIS NEXIUM. HE WOULD LIKE DR. DAUB TO PRESCRIBE HIM SOMETHING CLOSE TO THAT. MAYBE SOMETHING THAT IS GENERIC? BEST PHONE [phone] (CELL)  PHARMACY CHOICE IS [facility] ON [address].  MBC

## 2014-07-27 ENCOUNTER — Ambulatory Visit: Payer: Federal, State, Local not specified - PPO

## 2014-08-25 IMAGING — CR DG ABDOMEN 1V
1 series · 1 of 1 positions shown · non-contrast
Comparison: None.

CLINICAL DATA: Abdominal pain.

ABDOMEN - 1 VIEW

[AP]
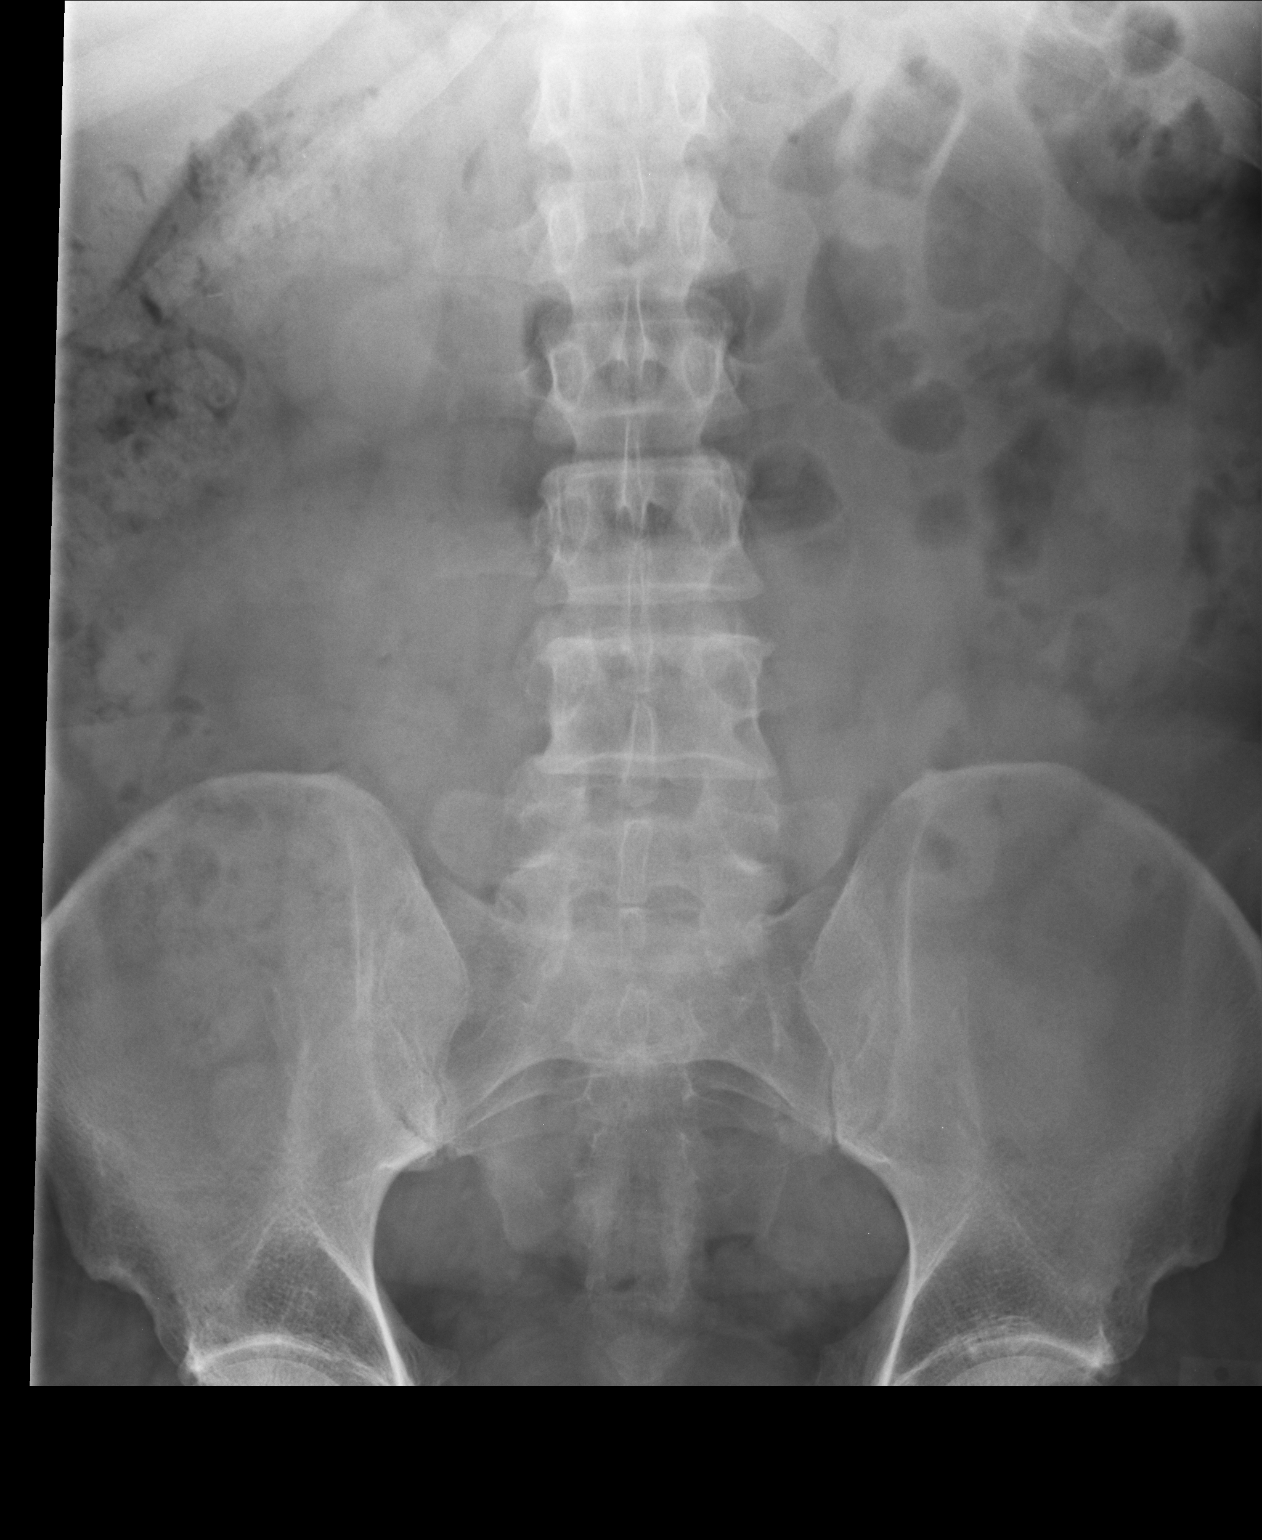

[1 of 1 positions shown; findings below may reference images not displayed]

FINDINGS: There is a nonobstructive bowel gas pattern.  No supine
evidence of free air.  No organomegaly or suspicious calcification.

No acute bony abnormality.
IMPRESSION: No acute findings.

Clinically significant discrepancy from primary report, if
provided: None

## 2014-10-14 IMAGING — CR DG LUMBAR SPINE 2-3V
2 series · 2 of 2 positions shown · non-contrast
Comparison: None.

CLINICAL DATA: Back pain

LUMBAR SPINE - 2-3 VIEW

[left lateral]
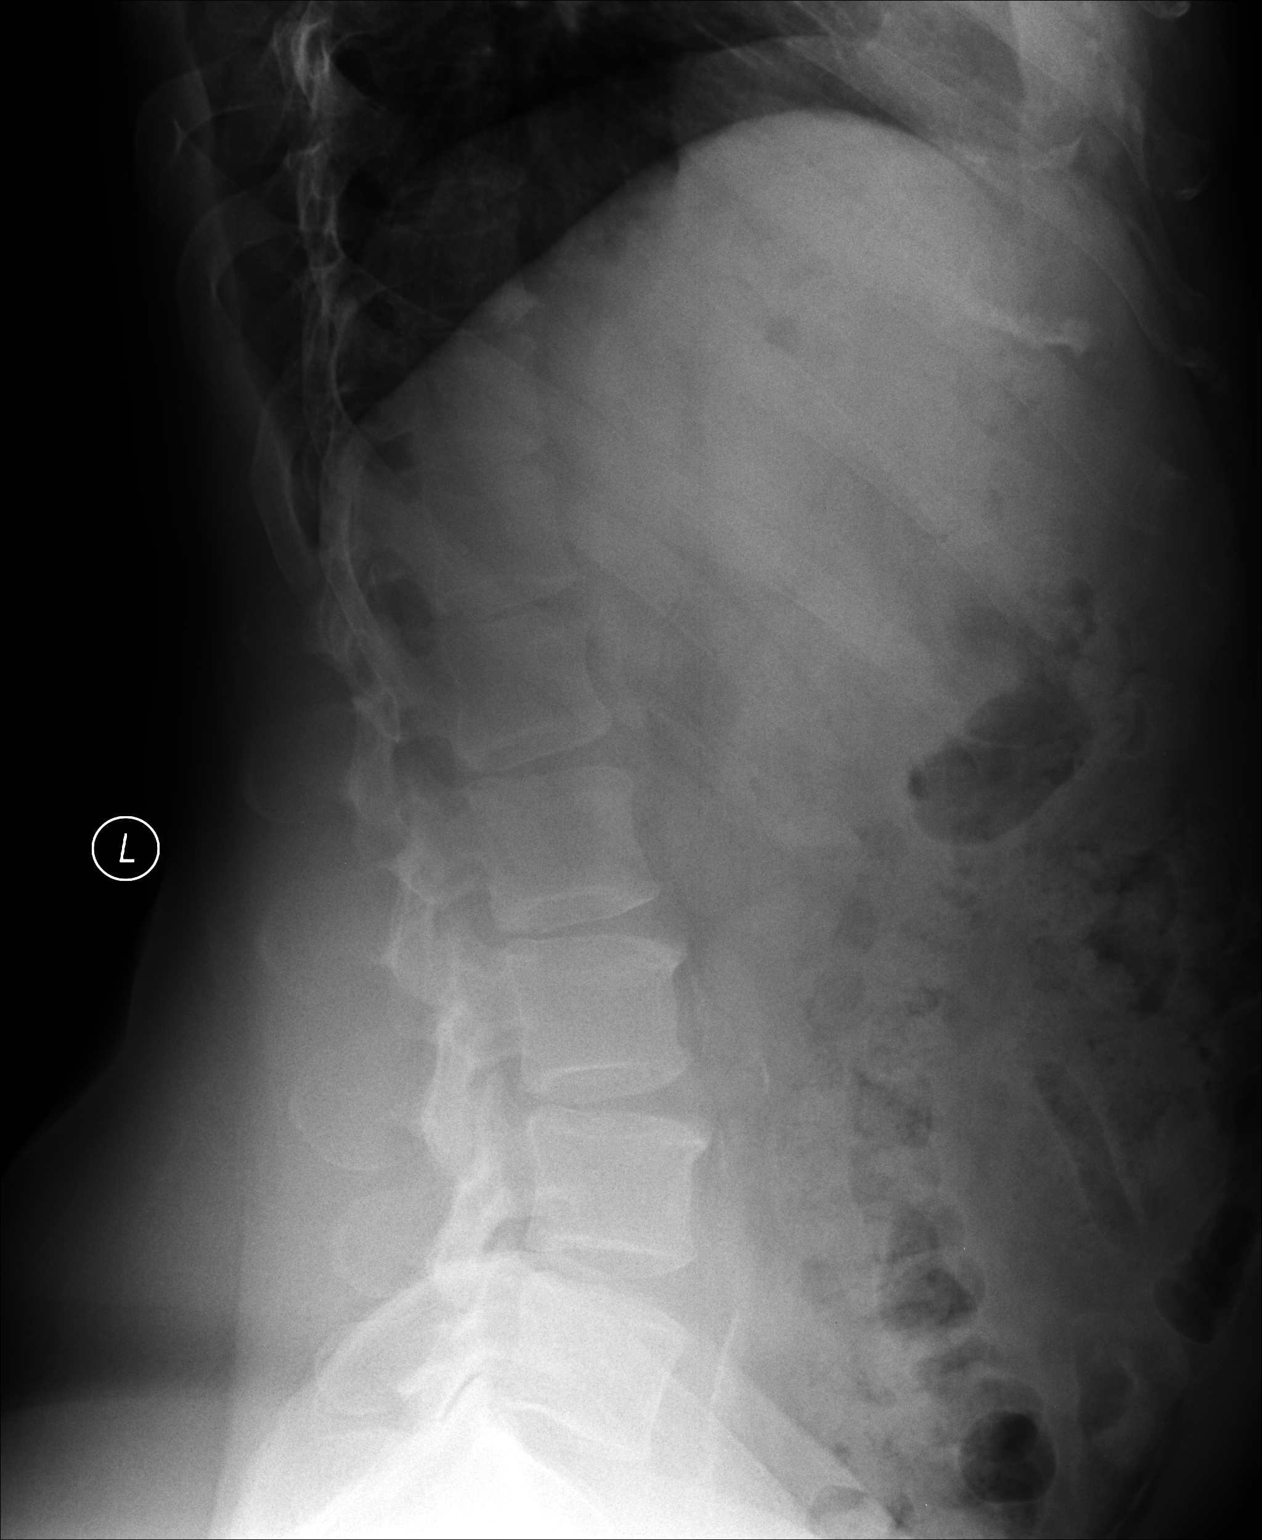

[AP]
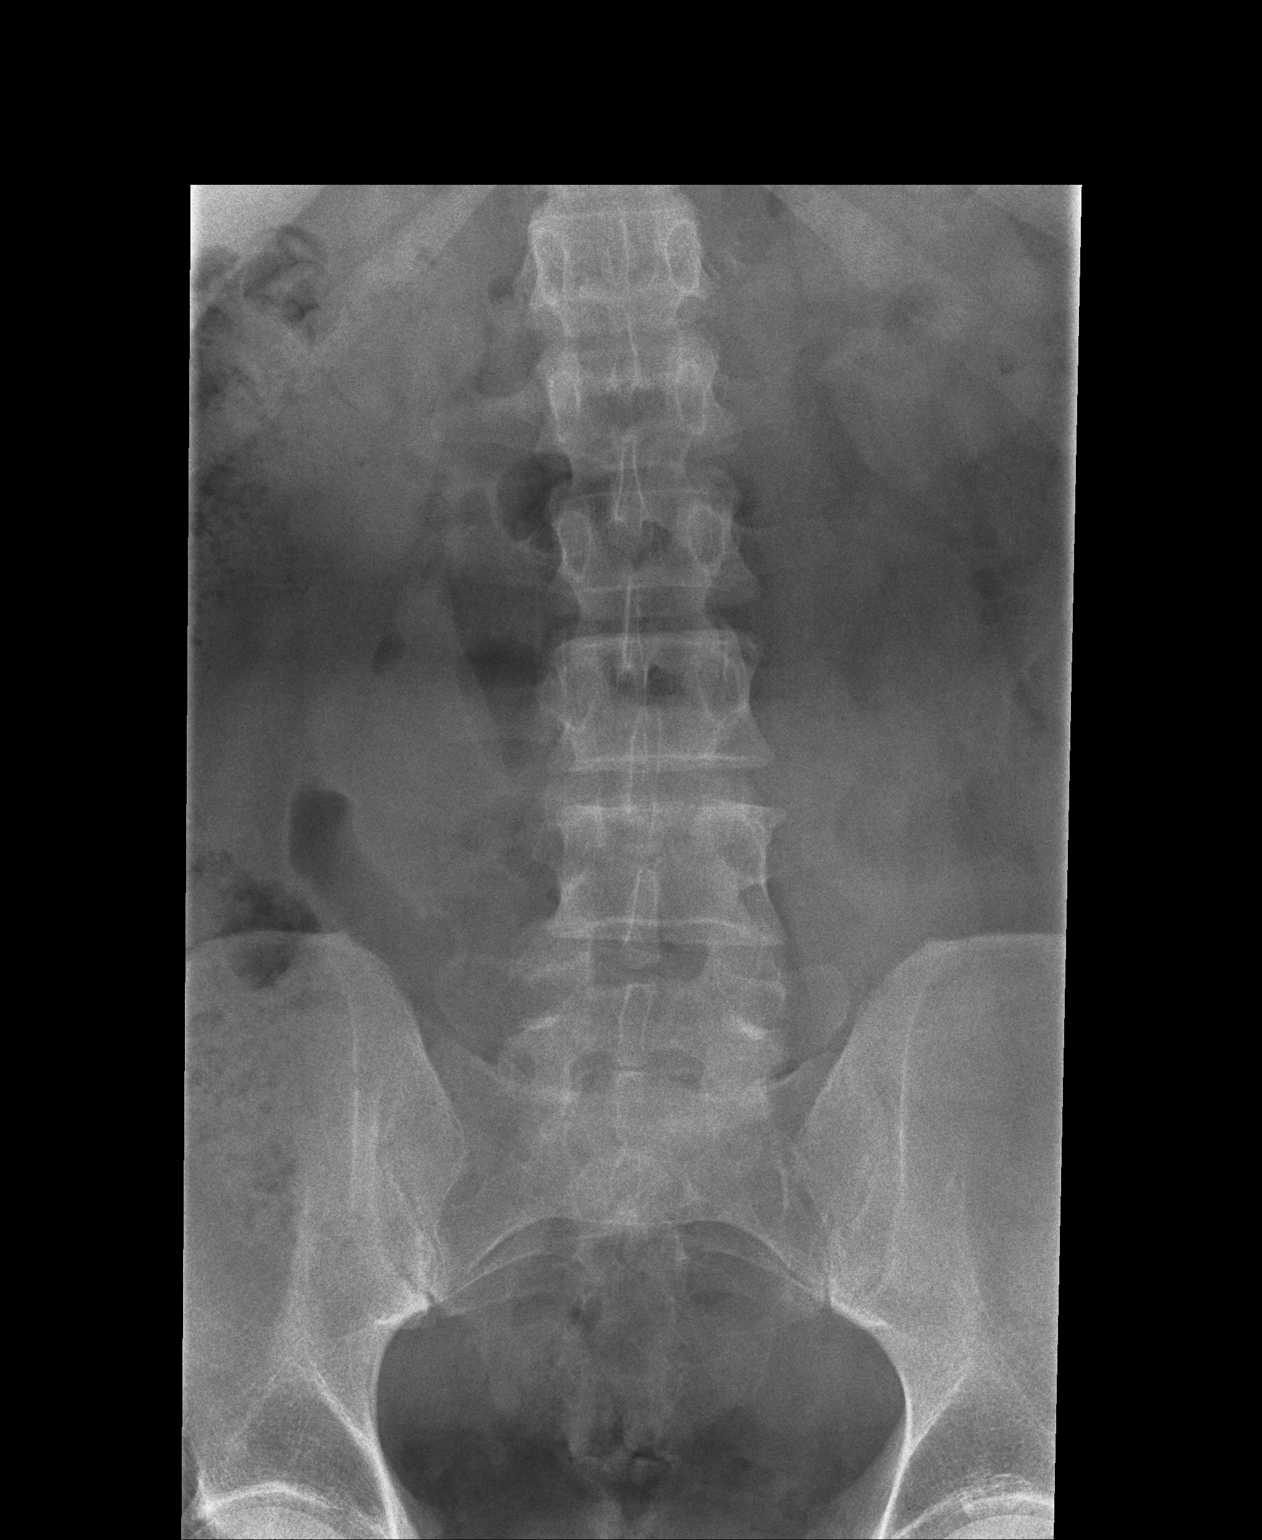

[2 of 2 positions shown; findings below may reference images not displayed]

FINDINGS: Frontal and lateral views were obtained.  There are six
non-rib bearing lumbar type vertebral bodies.  There is no fracture
or spondylolisthesis.  There is minimal levoscoliosis.  There are
small anterior osteophytes at L4-L5.  There is slight disc space
narrowing at the L3-4, L4-5, and L5-6.  No erosive change.
IMPRESSION: Mild scoliosis and mild osteoarthritic change.  No
fracture or spondylolisthesis.

## 2014-10-31 ENCOUNTER — Encounter: Payer: Self-pay | Admitting: Emergency Medicine

## 2014-10-31 ENCOUNTER — Ambulatory Visit (INDEPENDENT_AMBULATORY_CARE_PROVIDER_SITE_OTHER): Payer: Federal, State, Local not specified - PPO | Admitting: Emergency Medicine

## 2014-10-31 VITALS — BP 130/76 | HR 75 | Temp 98.4°F | Resp 16 | Ht 69.0 in | Wt 224.0 lb

## 2014-10-31 DIAGNOSIS — R519 Headache, unspecified: Secondary | ICD-10-CM

## 2014-10-31 DIAGNOSIS — R002 Palpitations: Secondary | ICD-10-CM | POA: Diagnosis not present

## 2014-10-31 DIAGNOSIS — Z0000 Encounter for general adult medical examination without abnormal findings: Secondary | ICD-10-CM | POA: Diagnosis not present

## 2014-10-31 DIAGNOSIS — R51 Headache: Secondary | ICD-10-CM

## 2014-10-31 DIAGNOSIS — E785 Hyperlipidemia, unspecified: Secondary | ICD-10-CM | POA: Diagnosis not present

## 2014-10-31 DIAGNOSIS — I10 Essential (primary) hypertension: Secondary | ICD-10-CM

## 2014-10-31 DIAGNOSIS — K219 Gastro-esophageal reflux disease without esophagitis: Secondary | ICD-10-CM | POA: Diagnosis not present

## 2014-10-31 LAB — CBC WITH DIFFERENTIAL/PLATELET
Basophils Absolute: 0 10*3/uL (ref 0.0–0.1)
Basophils Relative: 0 % (ref 0–1)
Eosinophils Absolute: 0.1 10*3/uL (ref 0.0–0.7)
Eosinophils Relative: 2 % (ref 0–5)
HCT: 41.3 % (ref 39.0–52.0)
Hemoglobin: 13.8 g/dL (ref 13.0–17.0)
Lymphocytes Relative: 38 % (ref 12–46)
Lymphs Abs: 2.7 10*3/uL (ref 0.7–4.0)
MCH: 26.4 pg (ref 26.0–34.0)
MCHC: 33.4 g/dL (ref 30.0–36.0)
MCV: 79 fL (ref 78.0–100.0)
MPV: 10.4 fL (ref 8.6–12.4)
Monocytes Absolute: 0.4 10*3/uL (ref 0.1–1.0)
Monocytes Relative: 6 % (ref 3–12)
Neutro Abs: 3.8 10*3/uL (ref 1.7–7.7)
Neutrophils Relative %: 54 % (ref 43–77)
Platelets: 207 10*3/uL (ref 150–400)
RBC: 5.23 MIL/uL (ref 4.22–5.81)
RDW: 14 % (ref 11.5–15.5)
WBC: 7.1 10*3/uL (ref 4.0–10.5)

## 2014-10-31 LAB — COMPLETE METABOLIC PANEL WITH GFR
ALT: 51 U/L (ref 0–53)
AST: 30 U/L (ref 0–37)
Albumin: 4.1 g/dL (ref 3.5–5.2)
Alkaline Phosphatase: 72 U/L (ref 39–117)
BUN: 17 mg/dL (ref 6–23)
CO2: 30 mEq/L (ref 19–32)
Calcium: 9.3 mg/dL (ref 8.4–10.5)
Chloride: 102 mEq/L (ref 96–112)
Creat: 0.95 mg/dL (ref 0.50–1.35)
GFR, Est African American: >89 mL/min
GFR, Est Non African American: >89 mL/min
Glucose, Bld: 108 mg/dL — ABNORMAL HIGH (ref 70–99)
Potassium: 4.3 mEq/L (ref 3.5–5.3)
Sodium: 140 mEq/L (ref 135–145)
Total Bilirubin: 0.5 mg/dL (ref 0.2–1.2)
Total Protein: 6 g/dL (ref 6.0–8.3)

## 2014-10-31 LAB — VITAMIN B12: Vitamin B-12: 492 pg/mL (ref 211–911)

## 2014-10-31 LAB — LIPID PANEL
Cholesterol: 165 mg/dL (ref 0–200)
HDL: 40 mg/dL (ref >=40)
LDL Cholesterol: 95 mg/dL (ref 0–99)
Total CHOL/HDL Ratio: 4.1 Ratio
Triglycerides: 152 mg/dL — ABNORMAL HIGH (ref <150)
VLDL: 30 mg/dL (ref 0–40)

## 2014-10-31 LAB — POCT UA - MICROSCOPIC ONLY
Casts, Ur, LPF, POC: NEGATIVE
Crystals, Ur, HPF, POC: NEGATIVE
Epithelial cells, urine per micros: NEGATIVE
Mucus, UA: NEGATIVE
WBC, Ur, HPF, POC: NEGATIVE
Yeast, UA: NEGATIVE

## 2014-10-31 LAB — POCT URINALYSIS DIPSTICK
Bilirubin, UA: NEGATIVE
Blood, UA: NEGATIVE
Glucose, UA: NEGATIVE
Ketones, UA: NEGATIVE
Leukocytes, UA: NEGATIVE
Nitrite, UA: NEGATIVE
Protein, UA: NEGATIVE
Spec Grav, UA: 1.015
Urobilinogen, UA: 0.2
pH, UA: 7

## 2014-10-31 LAB — FERRITIN: Ferritin: 187 ng/mL (ref 22–322)

## 2014-10-31 LAB — MAGNESIUM: Magnesium: 1.9 mg/dL (ref 1.5–2.5)

## 2014-10-31 LAB — TSH: TSH: 1.468 u[IU]/mL (ref 0.350–4.500)

## 2014-10-31 MED ORDER — OMEPRAZOLE 40 MG PO CPDR: 40.0000 mg | DELAYED_RELEASE_CAPSULE | Freq: Every day | ORAL | Status: DC

## 2014-10-31 MED ORDER — LISINOPRIL 20 MG PO TABS: ORAL_TABLET | ORAL | Status: DC

## 2014-10-31 MED ORDER — ATORVASTATIN CALCIUM 40 MG PO TABS
40.0000 mg | ORAL_TABLET | Freq: Every day | ORAL | Status: DC
Start: 2014-10-31 — End: 2015-09-18

## 2014-10-31 NOTE — Progress Notes (Deleted)
   Subjective:    Patient ID: Drew Miles, male    DOB: Oct 08, 1960, 54 y.o.   MRN: [id number]  HPI    Review of Systems  Constitutional: Negative.   HENT: Positive for sinus pressure.   Eyes: Negative.   Respiratory: Negative.   Cardiovascular: Negative.   Gastrointestinal: Negative.   Endocrine: Negative.   Genitourinary: Negative.   Musculoskeletal: Negative.   Skin: Negative.   Allergic/Immunologic: Negative.   Neurological: Positive for light-headedness and headaches.  Hematological: Negative.   Psychiatric/Behavioral: Negative.        Objective:   Physical Exam        Assessment & Plan:

## 2014-10-31 NOTE — Progress Notes (Addendum)
Subjective:  This chart was scribed for Drew Gobble, MD by Drew Miles, ED Scribe. The patient was seen in room 22. Patient's care was started at 8:42 AM.   Patient ID: Drew Miles, male    DOB: 1961/02/03, 54 y.o.   MRN: [id number]  Chief Complaint  Patient presents with  . Annual Exam  . Medication Refill   HPI HPI Comments: Drew Miles is a 54 y.o. male who presents to the [facility] for an annual exam and medication refill.   Pt presents with intermittent moderate sinus pressure that has frequently occurred for the past 6-7 months. He reports associated frontal HA and nasal congestion that he attributes to environmental allergies. Pt treats symptoms with Goody's powder with temporary relief.   Memory Loss Pt expresses concerns of memory loss that he has experienced for a while. Pt states that some days he has a tough time remembering information at work, but other days he seems to remember everything. He reports that his wife and coworkers have not noticed decreased memory. Pt's father passed from dementia and pt expresses a concern with the connection between dementia and PPIs that he takes for GERD.   Preventative Maintenance  Pt sees his ophthalmologist regularly. He walks for exercise.   Pt's sons currently reside in [place]. His daughter will move from [place], [place] to [place], [place] with her fiance next month.  Past Medical History  Diagnosis Date  . Hypertension   . Hyperlipidemia   . Allergy    Current Outpatient Prescriptions on File Prior to Visit  Medication Sig Dispense Refill  . aspirin EC 81 MG tablet Take 81 mg by mouth daily.    Marland Kitchen atorvastatin (LIPITOR) 40 MG tablet Take 1 tablet (40 mg total) by mouth daily. 90 tablet 3  . b complex vitamins tablet Take 1 tablet by mouth daily.    . Cholecalciferol (VITAMIN D) 400 UNITS capsule Take 400 Units by mouth daily.    . fish oil-omega-3 fatty acids 1000 MG capsule Take 2 g by mouth daily.    Marland Kitchen  lisinopril (PRINIVIL,ZESTRIL) 20 MG tablet take 1 tablet by mouth every morning and 1/2 to 1 tablet by mouth at bedtime for blood pressure 180 tablet 3  . Multiple Vitamin (MULTIVITAMIN) tablet Take 1 tablet by mouth daily.    Marland Kitchen omeprazole (PRILOSEC) 40 MG capsule Take 1 capsule (40 mg total) by mouth daily. 30 capsule 11  . Ranitidine HCl (ZANTAC PO) Take by mouth.    . terbinafine (LAMISIL) 250 MG tablet Take 1 tablet (250 mg total) by mouth daily. 30 tablet 2   No current facility-administered medications on file prior to visit.   Allergies  Allergen Reactions  . Anesthetics, Ester Other (See Comments)    INCREASED BP    Review of Systems  HENT: Positive for congestion and sinus pressure.   Allergic/Immunologic: Positive for environmental allergies.  Neurological: Positive for headaches.   BP 130/76 mmHg  Pulse 75  Temp(Src) 98.4 F (36.9 C) (Oral)  Resp 16  Ht  (1.753 m)  Wt 224 lb (101.606 kg)  BMI 33.06 kg/m2  SpO2 96%    Objective:   Physical Exam  Constitutional: He is oriented to person, place, and time. He appears well-developed and well-nourished. No distress.  HENT:  Head: Normocephalic and atraumatic.  Eyes: Conjunctivae and EOM are normal.  Neck: Neck supple. No tracheal deviation present.  Cardiovascular: Normal rate.   Pulmonary/Chest: Effort  normal. No respiratory distress.  Genitourinary: Prostate normal, testes normal and penis normal.  No nodularity.   Musculoskeletal: Normal range of motion.  Neurological: He is alert and oriented to person, place, and time.  Skin: Skin is warm and dry.  Multiple pigmented moles. None appear suspicious.   Psychiatric: He has a normal mood and affect. His behavior is normal.  Nursing note and vitals reviewed.     Assessment & Plan:  1. Routine general medical examination at a health care facility  - CBC with Differential/Platelet - PSA - POCT UA - Microscopic Only - POCT urinalysis dipstick - POC  Hemoccult Bld/Stl (3-Cd Home Screen); Future  2. Gastroesophageal reflux disease, esophagitis presence not specified Patient did have concerns about memory loss due to chronic PPI therapy. A Mini-Mental status exam was done with perfect score - COMPLETE METABOLIC PANEL WITH GFR - Vitamin B12 - Magnesium - Vit D  25 hydroxy (rtn osteoporosis monitoring) - Ferritin - omeprazole (PRILOSEC) 40 MG capsule; Take 1 capsule (40 mg total) by mouth daily.  Dispense: 90 capsule; Refill: 3  3. Hyperlipidemia  - Lipid panel - atorvastatin (LIPITOR) 40 MG tablet; Take 1 tablet (40 mg total) by mouth daily.  Dispense: 90 tablet; Refill: 3  4. Palpitations This has not been a complaint recently his baseline EKG is normal - EKG 12-Lead  5. Essential hypertension  - TSH - lisinopril (PRINIVIL,ZESTRIL) 20 MG tablet; take 1 tablet by mouth every morning and 1/2 to 1 tablet by mouth at bedtime for blood pressure  Dispense: 180 tablet; Refill: 3  6. Persistent headaches  - CT Head Wo Contrast; Future   I personally performed the services described in this documentation, which was scribed in my presence. The recorded information has been reviewed and is accurate.  Drew ChrisSteven Lakie Mclouth, MD  [facility]  10/31/2014 9:28 AM

## 2014-11-01 LAB — PSA: PSA: 0.3 ng/mL (ref <=4.00)

## 2014-11-01 LAB — VITAMIN D 25 HYDROXY (VIT D DEFICIENCY, FRACTURES): Vit D, 25-Hydroxy: 28 ng/mL — ABNORMAL LOW (ref 30–100)

## 2014-11-02 ENCOUNTER — Telehealth: Payer: Self-pay | Admitting: Radiology

## 2014-11-11 ENCOUNTER — Ambulatory Visit
Admission: RE | Admit: 2014-11-11 | Discharge: 2014-11-11 | Disposition: A | Discharge status: Home / Self Care | Payer: Federal, State, Local not specified - PPO | Source: Ambulatory Visit | Attending: Emergency Medicine | Admitting: Emergency Medicine

## 2014-11-11 ENCOUNTER — Other Ambulatory Visit: Payer: Self-pay

## 2014-11-11 DIAGNOSIS — R51 Headache: Principal | ICD-10-CM

## 2014-11-11 DIAGNOSIS — Z0000 Encounter for general adult medical examination without abnormal findings: Secondary | ICD-10-CM

## 2014-11-11 DIAGNOSIS — R519 Headache, unspecified: Secondary | ICD-10-CM

## 2014-11-11 LAB — POC HEMOCCULT BLD/STL (HOME/3-CARD/SCREEN)
Card #2 Fecal Occult Blod, POC: NEGATIVE
Card #3 Fecal Occult Blood, POC: NEGATIVE
Fecal Occult Blood, POC: NEGATIVE

## 2015-02-07 ENCOUNTER — Telehealth: Payer: Self-pay

## 2015-02-07 NOTE — Telephone Encounter (Signed)
PA completed on covermymeds for generic nexium. Pending.

## 2015-02-10 NOTE — Telephone Encounter (Signed)
PA approved through 02/07/16. No denied claim yet, just update for next year so no need to contact pharm.

## 2015-02-16 ENCOUNTER — Ambulatory Visit (INDEPENDENT_AMBULATORY_CARE_PROVIDER_SITE_OTHER): Payer: Federal, State, Local not specified - PPO

## 2015-02-16 DIAGNOSIS — Z23 Encounter for immunization: Secondary | ICD-10-CM

## 2015-03-04 ENCOUNTER — Encounter: Payer: Self-pay | Admitting: Emergency Medicine

## 2015-09-09 ENCOUNTER — Encounter: Payer: Self-pay | Admitting: Emergency Medicine

## 2015-09-09 DIAGNOSIS — M722 Plantar fascial fibromatosis: Secondary | ICD-10-CM | POA: Diagnosis not present

## 2015-09-09 DIAGNOSIS — M79672 Pain in left foot: Secondary | ICD-10-CM | POA: Diagnosis not present

## 2015-09-16 DIAGNOSIS — K080 Exfoliation of teeth due to systemic causes: Secondary | ICD-10-CM | POA: Diagnosis not present

## 2015-09-18 ENCOUNTER — Encounter: Payer: Self-pay | Admitting: Emergency Medicine

## 2015-09-18 ENCOUNTER — Ambulatory Visit (INDEPENDENT_AMBULATORY_CARE_PROVIDER_SITE_OTHER): Payer: Federal, State, Local not specified - PPO | Admitting: Emergency Medicine

## 2015-09-18 VITALS — BP 130/82 | HR 72 | Temp 98.4°F | Resp 16 | Ht 69.5 in | Wt 202.6 lb

## 2015-09-18 DIAGNOSIS — Z0000 Encounter for general adult medical examination without abnormal findings: Secondary | ICD-10-CM

## 2015-09-18 DIAGNOSIS — I10 Essential (primary) hypertension: Secondary | ICD-10-CM | POA: Diagnosis not present

## 2015-09-18 DIAGNOSIS — Z114 Encounter for screening for human immunodeficiency virus [HIV]: Secondary | ICD-10-CM | POA: Diagnosis not present

## 2015-09-18 DIAGNOSIS — K219 Gastro-esophageal reflux disease without esophagitis: Secondary | ICD-10-CM

## 2015-09-18 DIAGNOSIS — Z1159 Encounter for screening for other viral diseases: Secondary | ICD-10-CM | POA: Diagnosis not present

## 2015-09-18 DIAGNOSIS — E785 Hyperlipidemia, unspecified: Secondary | ICD-10-CM | POA: Diagnosis not present

## 2015-09-18 LAB — COMPLETE METABOLIC PANEL WITH GFR
ALT: 13 U/L (ref 9–46)
AST: 13 U/L (ref 10–35)
Albumin: 4.2 g/dL (ref 3.6–5.1)
Alkaline Phosphatase: 55 U/L (ref 40–115)
BUN: 13 mg/dL (ref 7–25)
CO2: 29 mmol/L (ref 20–31)
Calcium: 9.3 mg/dL (ref 8.6–10.3)
Chloride: 104 mmol/L (ref 98–110)
Creat: 1.03 mg/dL (ref 0.70–1.33)
GFR, Est African American: >89 mL/min (ref >=60)
GFR, Est Non African American: 83 mL/min (ref >=60)
Glucose, Bld: 84 mg/dL (ref 65–99)
Potassium: 3.7 mmol/L (ref 3.5–5.3)
Sodium: 141 mmol/L (ref 135–146)
Total Bilirubin: 0.6 mg/dL (ref 0.2–1.2)
Total Protein: 6.1 g/dL (ref 6.1–8.1)

## 2015-09-18 LAB — POCT URINALYSIS DIP (MANUAL ENTRY)
Bilirubin, UA: NEGATIVE
Glucose, UA: NEGATIVE
Ketones, POC UA: NEGATIVE
Leukocytes, UA: NEGATIVE
Nitrite, UA: NEGATIVE
Protein Ur, POC: NEGATIVE
Spec Grav, UA: 1.01
Urobilinogen, UA: 0.2
pH, UA: 7

## 2015-09-18 LAB — POC MICROSCOPIC URINALYSIS (UMFC): Mucus: ABSENT

## 2015-09-18 LAB — LIPID PANEL
Cholesterol: 139 mg/dL (ref 125–200)
HDL: 38 mg/dL — ABNORMAL LOW (ref >=40)
LDL Cholesterol: 64 mg/dL (ref <130)
Total CHOL/HDL Ratio: 3.7 Ratio (ref <=5.0)
Triglycerides: 183 mg/dL — ABNORMAL HIGH (ref <150)
VLDL: 37 mg/dL — ABNORMAL HIGH (ref <30)

## 2015-09-18 LAB — HIV ANTIBODY (ROUTINE TESTING W REFLEX): HIV 1&2 Ab, 4th Generation: NONREACTIVE

## 2015-09-18 LAB — HEPATITIS C ANTIBODY: HCV Ab: NEGATIVE

## 2015-09-18 LAB — TSH: TSH: 1.46 mIU/L (ref 0.40–4.50)

## 2015-09-18 LAB — HEMOGLOBIN A1C
Hgb A1c MFr Bld: 6 % — ABNORMAL HIGH (ref <5.7)
Mean Plasma Glucose: 126 mg/dL

## 2015-09-18 MED ORDER — OMEPRAZOLE 40 MG PO CPDR: 40.0000 mg | DELAYED_RELEASE_CAPSULE | Freq: Every day | ORAL | Status: DC

## 2015-09-18 MED ORDER — ATORVASTATIN CALCIUM 40 MG PO TABS: 40.0000 mg | ORAL_TABLET | Freq: Every day | ORAL | Status: DC

## 2015-09-18 MED ORDER — LISINOPRIL 20 MG PO TABS: ORAL_TABLET | ORAL | Status: DC

## 2015-09-18 NOTE — Progress Notes (Signed)
Patient ID: Drew Miles, male   DOB: 1960-11-07, 55 y.o.   MRN: [id number]     By signing my name below, I, Littie Deeds, attest that this documentation has been prepared under the direction and in the presence of Lesle Chris, MD.  Electronically Signed: Littie Deeds, Medical Scribe. 09/18/2015. 2:19 PM.   Chief Complaint:  Chief Complaint  Patient presents with  . Annual Exam  . Medication Refill    HPI: Drew Miles is a 55 y.o. male who reports to [facility] today for an annual exam. He states his most recent colonoscopy was normal.   Patient is currently dealing with plantar fasciitis in his left foot, which is followed by Dr. Suzette Battiest, DPM. He is currently on a 12 day course of prednisone. He has been rolling his foot with a refrigerated bottle of water. He has had some difficulty sleeping due to his symptoms.  He has been noted to have some memory issues (often forgetting things such as names, things at work) by his wife. He states this does affect his work some.  His daughter (his second child) will be getting married in [place] on May 7th.  Past Medical History  Diagnosis Date  . Hypertension   . Hyperlipidemia   . Allergy    Past Surgical History  Procedure Laterality Date  . Vasectomy    . Shoulder surgery  2013   Social History   Social History  . Marital Status: Married    Spouse Name: N/A  . Number of Children: N/A  . Years of Education: N/A   Occupational History  . Revenue Agent    Social History Main Topics  . Smoking status: Former Smoker -- 5 years    Types: Cigarettes    Quit date: 05/31/1980  . Smokeless tobacco: Never Used  . Alcohol Use: No  . Drug Use: No  . Sexual Activity: Yes   Other Topics Concern  . None   Social History Narrative   Married. Education: [employer]. Exercise: No   Family History  Problem Relation Age of Onset  . Diverticulitis Mother   . Dementia Father   . Epilepsy Son   . Hyperlipidemia Brother   . Mental illness  Brother    Allergies  Allergen Reactions  . Anesthetics, Ester Other (See Comments)    INCREASED BP   Prior to Admission medications   Medication Sig Start Date End Date Taking? Authorizing Provider  aspirin EC 81 MG tablet Take 81 mg by mouth daily.   Yes Historical Provider, MD  atorvastatin (LIPITOR) 40 MG tablet Take 1 tablet (40 mg total) by mouth daily. 10/31/14  Yes [name], MD  Cholecalciferol (VITAMIN D) 400 UNITS capsule Take 400 Units by mouth daily.   Yes Historical Provider, MD  fish oil-omega-3 fatty acids 1000 MG capsule Take 2 g by mouth daily.   Yes Historical Provider, MD  lisinopril (PRINIVIL,ZESTRIL) 20 MG tablet take 1 tablet by mouth every morning and 1/2 to 1 tablet by mouth at bedtime for blood pressure 10/31/14  Yes [name], MD  Multiple Vitamin (MULTIVITAMIN) tablet Take 1 tablet by mouth daily.   Yes Historical Provider, MD  omeprazole (PRILOSEC) 40 MG capsule Take 1 capsule (40 mg total) by mouth daily. 10/31/14  Yes [name], MD  terbinafine (LAMISIL) 250 MG tablet Take 1 tablet (250 mg total) by mouth daily. Patient not taking: Reported on 10/31/2014 02/06/13   [name], MD  ROS: 13 point ROS reviewed on patient health survey. No positive responses. Negative other than listed above or in nursing note. See nursing note.   All other systems have been reviewed and were otherwise negative with the exception of those mentioned in the HPI and as above.    PHYSICAL EXAM: Filed Vitals:   09/18/15 1332  BP: 130/82  Pulse: 72  Temp: 98.4 F (36.9 C)  Resp: 16   Body mass index is 29.5 kg/(m^2).   General: Alert, no acute distress HEENT:  Normocephalic, atraumatic, oropharynx patent. Eye: [facility] Cardiovascular:  Regular rate and rhythm, no rubs murmurs or gallops.  No Carotid bruits, radial pulse intact. No pedal edema.  Respiratory: Clear to auscultation bilaterally.  No wheezes, rales, or rhonchi.  No cyanosis, no use of accessory  musculature Abdominal: No organomegaly, abdomen is soft and non-tender, positive bowel sounds.  No masses. Musculoskeletal: Gait intact. No edema, tenderness Skin: No rashes. Neurologic: Facial musculature symmetric. Psychiatric: Patient acts appropriately throughout our interaction. Lymphatic: No cervical or submandibular lymphadenopathy Genitourinary: Prostate is symmetrically slightly enlarged without nodules.   LABS: Results for orders placed or performed in visit on 09/18/15  COMPLETE METABOLIC PANEL WITH GFR  Result Value Ref Range   Sodium 141 135 - 146 mmol/L   Potassium 3.7 3.5 - 5.3 mmol/L   Chloride 104 98 - 110 mmol/L   CO2 29 20 - 31 mmol/L   Glucose, Bld 84 65 - 99 mg/dL   BUN 13 7 - 25 mg/dL   Creat 1.61 0.96 - 0.45 mg/dL   Total Bilirubin 0.6 0.2 - 1.2 mg/dL   Alkaline Phosphatase 55 40 - 115 U/L   AST 13 10 - 35 U/L   ALT 13 9 - 46 U/L   Total Protein 6.1 6.1 - 8.1 g/dL   Albumin 4.2 3.6 - 5.1 g/dL   Calcium 9.3 8.6 - 40.9 mg/dL   GFR, Est African American >89 >=60 mL/min   GFR, Est Non African American 83 >=60 mL/min  HIV antibody  Result Value Ref Range   HIV 1&2 Ab, 4th Generation NONREACTIVE NONREACTIVE  Hepatitis C antibody  Result Value Ref Range   HCV Ab NEGATIVE NEGATIVE  Lipid panel  Result Value Ref Range   Cholesterol 139 125 - 200 mg/dL   Triglycerides 811 (H) <150 mg/dL   HDL 38 (L) >=91 mg/dL   Total CHOL/HDL Ratio 3.7 <=5.0 Ratio   VLDL 37 (H) <30 mg/dL   LDL Cholesterol 64 <478 mg/dL  PSA  Result Value Ref Range   PSA  <=4.00 ng/mL  TSH  Result Value Ref Range   TSH 1.46 0.40 - 4.50 mIU/L  POCT urinalysis dipstick  Result Value Ref Range   Color, UA yellow yellow   Clarity, UA clear clear   Glucose, UA negative negative   Bilirubin, UA negative negative   Ketones, POC UA negative negative   Spec Grav, UA 1.010    Blood, UA small (A) negative   pH, UA 7.0    Protein Ur, POC negative negative   Urobilinogen, UA 0.2     Nitrite, UA Negative Negative   Leukocytes, UA Negative Negative  POCT Microscopic Urinalysis ([facility])  Result Value Ref Range   WBC,UR,HPF,POC None None WBC/hpf   RBC,UR,HPF,POC None None RBC/hpf   Bacteria None None, Too numerous to count   Mucus Absent Absent   Epithelial Cells, UR Per Microscopy None None, Too numerous to count cells/hpf     EKG/XRAY:  [facility]   ASSESSMENT/PLAN: No abnormalities noted on his exam. His prostate is minimally enlarged. I encouraged him to continue to work on weight loss and exercise. There were no changes in medication at the present time. He has a wedding to go to in 2 weeks. We will schedule a physical exam in about a year with Dr. Neva SeatGreene I personally performed the services described in this documentation, which was scribed in my presence. The recorded information has been reviewed and is accurate.    Gross sideeffects, risk and benefits, and alternatives of medications d/w patient. Patient is aware that all medications have potential sideeffects and we are unable to predict every sideeffect or drug-drug interaction that may occur.  Lesle ChrisSteven Jesseca Marsch MD 09/18/2015 2:19 PM

## 2015-09-18 NOTE — Progress Notes (Signed)
   Subjective:    Patient ID: Drew Miles, male    DOB: 1960-11-20, 55 y.o.   MRN: [id number]  HPI    Review of Systems  Constitutional: Negative.   HENT: Negative.   Eyes: Negative.   Respiratory: Negative.   Cardiovascular: Negative.   Gastrointestinal: Negative.   Endocrine: Negative.   Genitourinary: Negative.   Musculoskeletal: Negative.   Skin: Negative.   Allergic/Immunologic: Negative.   Neurological: Negative.   Hematological: Negative.   Psychiatric/Behavioral: Negative.        Objective:   Physical Exam        Assessment & Plan:

## 2015-09-18 NOTE — Patient Instructions (Addendum)
   IF you received an x-ray today, you will receive an invoice from [facility]. Please contact [facility] at [phone] with questions or concerns regarding your invoice.   IF you received labwork today, you will receive an invoice from [facility]. Please contact [facility] at [phone] with questions or concerns regarding your invoice.   Our billing staff will not be able to assist you with questions regarding bills from these companies.  You will be contacted with the lab results as soon as they are available. The fastest way to get your results is to activate your My Chart account. Instructions are located on the last page of this paperwork. If you have not heard from us regarding the results in 2 weeks, please contact this office.    Health Maintenance, Male A healthy lifestyle and preventative care can promote health and wellness.  Maintain regular health, dental, and eye exams.  Eat a healthy diet. Foods like vegetables, fruits, whole grains, low-fat dairy products, and lean protein foods contain the nutrients you need and are low in calories. Decrease your intake of foods high in solid fats, added sugars, and salt. Get information about a proper diet from your health care provider, if necessary.  Regular physical exercise is one of the most important things you can do for your health. Most adults should get at least 150 minutes of moderate-intensity exercise (any activity that increases your heart rate and causes you to sweat) each week. In addition, most adults need muscle-strengthening exercises on 2 or more days a week.   Maintain a healthy weight. The body mass index (BMI) is a screening tool to identify possible weight problems. It provides an estimate of body fat based on height and weight. Your health care provider can find your BMI and can help you achieve or maintain a healthy weight. For males 20 years and older:  A BMI  below 18.5 is considered underweight.  A BMI of 18.5 to 24.9 is normal.  A BMI of 25 to 29.9 is considered overweight.  A BMI of 30 and above is considered obese.  Maintain normal blood lipids and cholesterol by exercising and minimizing your intake of saturated fat. Eat a balanced diet with plenty of fruits and vegetables. Blood tests for lipids and cholesterol should begin at age 20 and be repeated every 5 years. If your lipid or cholesterol levels are high, you are over age 50, or you are at high risk for heart disease, you may need your cholesterol levels checked more frequently.Ongoing high lipid and cholesterol levels should be treated with medicines if diet and exercise are not working.  If you smoke, find out from your health care provider how to quit. If you do not use tobacco, do not start.  Lung cancer screening is recommended for adults aged 55-80 years who are at high risk for developing lung cancer because of a history of smoking. A yearly low-dose CT scan of the lungs is recommended for people who have at least a 30-pack-year history of smoking and are current smokers or have quit within the past 15 years. A pack year of smoking is smoking an average of 1 pack of cigarettes a day for 1 year (for example, a 30-pack-year history of smoking could mean smoking 1 pack a day for 30 years or 2 packs a day for 15 years). Yearly screening should continue until the smoker has stopped smoking for at least 15 years. Yearly screening should be stopped   for people who develop a health problem that would prevent them from having lung cancer treatment.  If you choose to drink alcohol, do not have more than 2 drinks per day. One drink is considered to be 12 oz (360 mL) of beer, 5 oz (150 mL) of wine, or 1.5 oz (45 mL) of liquor.  Avoid the use of street drugs. Do not share needles with anyone. Ask for help if you need support or instructions about stopping the use of drugs.  High blood pressure  causes heart disease and increases the risk of stroke. High blood pressure is more likely to develop in:  People who have blood pressure in the end of the normal range (100-139/85-89 mm Hg).  People who are overweight or obese.  People who are African American.  If you are 18-39 years of age, have your blood pressure checked every 3-5 years. If you are 40 years of age or older, have your blood pressure checked every year. You should have your blood pressure measured twice--once when you are at a hospital or clinic, and once when you are not at a hospital or clinic. Record the average of the two measurements. To check your blood pressure when you are not at a hospital or clinic, you can use:  An automated blood pressure machine at a pharmacy.  A home blood pressure monitor.  If you are 45-79 years old, ask your health care provider if you should take aspirin to prevent heart disease.  Diabetes screening involves taking a blood sample to check your fasting blood sugar level. This should be done once every 3 years after age 45 if you are at a normal weight and without risk factors for diabetes. Testing should be considered at a younger age or be carried out more frequently if you are overweight and have at least 1 risk factor for diabetes.  Colorectal cancer can be detected and often prevented. Most routine colorectal cancer screening begins at the age of 50 and continues through age 75. However, your health care provider may recommend screening at an earlier age if you have risk factors for colon cancer. On a yearly basis, your health care provider may provide home test kits to check for hidden blood in the stool. A small camera at the end of a tube may be used to directly examine the colon (sigmoidoscopy or colonoscopy) to detect the earliest forms of colorectal cancer. Talk to your health care provider about this at age 50 when routine screening begins. A direct exam of the colon should be repeated  every 5-10 years through age 75, unless early forms of precancerous polyps or small growths are found.  People who are at an increased risk for hepatitis B should be screened for this virus. You are considered at high risk for hepatitis B if:  You were born in a country where hepatitis B occurs often. Talk with your health care provider about which countries are considered high risk.  Your parents were born in a high-risk country and you have not received a shot to protect against hepatitis B (hepatitis B vaccine).  You have HIV or AIDS.  You use needles to inject street drugs.  You live with, or have sex with, someone who has hepatitis B.  You are a man who has sex with other men (MSM).  You get hemodialysis treatment.  You take certain medicines for conditions like cancer, organ transplantation, and autoimmune conditions.  Hepatitis C blood testing is recommended for   all people born from 1945 through 1965 and any individual with known risk factors for hepatitis C.  Healthy men should no longer receive prostate-specific antigen (PSA) blood tests as part of routine cancer screening. Talk to your health care provider about prostate cancer screening.  Testicular cancer screening is not recommended for adolescents or adult males who have no symptoms. Screening includes self-exam, a health care provider exam, and other screening tests. Consult with your health care provider about any symptoms you have or any concerns you have about testicular cancer.  Practice safe sex. Use condoms and avoid high-risk sexual practices to reduce the spread of sexually transmitted infections (STIs).  You should be screened for STIs, including gonorrhea and chlamydia if:  You are sexually active and are younger than 24 years.  You are older than 24 years, and your health care provider tells you that you are at risk for this type of infection.  Your sexual activity has changed since you were last screened,  and you are at an increased risk for chlamydia or gonorrhea. Ask your health care provider if you are at risk.  If you are at risk of being infected with HIV, it is recommended that you take a prescription medicine daily to prevent HIV infection. This is called pre-exposure prophylaxis (PrEP). You are considered at risk if:  You are a man who has sex with other men (MSM).  You are a heterosexual man who is sexually active with multiple partners.  You take drugs by injection.  You are sexually active with a partner who has HIV.  Talk with your health care provider about whether you are at high risk of being infected with HIV. If you choose to begin PrEP, you should first be tested for HIV. You should then be tested every 3 months for as long as you are taking PrEP.  Use sunscreen. Apply sunscreen liberally and repeatedly throughout the day. You should seek shade when your shadow is shorter than you. Protect yourself by wearing long sleeves, pants, a wide-brimmed hat, and sunglasses year round whenever you are outdoors.  Tell your health care provider of new moles or changes in moles, especially if there is a change in shape or color. Also, tell your health care provider if a mole is larger than the size of a pencil eraser.  A one-time screening for abdominal aortic aneurysm (AAA) and surgical repair of large AAAs by ultrasound is recommended for men aged 65-75 years who are current or former smokers.  Stay current with your vaccines (immunizations).   This information is not intended to replace advice given to you by your health care provider. Make sure you discuss any questions you have with your health care provider.   Document Released: 11/13/2007 Document Revised: 06/07/2014 Document Reviewed: 10/12/2010 Elsevier Interactive Patient Education 2016 Elsevier Inc.  

## 2015-09-19 LAB — PSA: PSA: 0.28 ng/mL (ref <=4.00)

## 2015-09-23 DIAGNOSIS — M722 Plantar fascial fibromatosis: Secondary | ICD-10-CM | POA: Diagnosis not present

## 2015-09-23 DIAGNOSIS — M65872 Other synovitis and tenosynovitis, left ankle and foot: Secondary | ICD-10-CM | POA: Diagnosis not present

## 2015-09-23 DIAGNOSIS — M792 Neuralgia and neuritis, unspecified: Secondary | ICD-10-CM | POA: Diagnosis not present

## 2015-10-07 DIAGNOSIS — M722 Plantar fascial fibromatosis: Secondary | ICD-10-CM | POA: Diagnosis not present

## 2015-12-10 DIAGNOSIS — M722 Plantar fascial fibromatosis: Secondary | ICD-10-CM | POA: Diagnosis not present

## 2015-12-29 DIAGNOSIS — M722 Plantar fascial fibromatosis: Secondary | ICD-10-CM | POA: Diagnosis not present

## 2016-01-22 DIAGNOSIS — M722 Plantar fascial fibromatosis: Secondary | ICD-10-CM | POA: Diagnosis not present

## 2016-02-03 ENCOUNTER — Telehealth: Payer: Self-pay

## 2016-02-03 NOTE — Telephone Encounter (Signed)
Patient would like for Dr. Cleta Albertsaub to put in a referral for memory specialist. Please advise!  [phone]

## 2016-02-03 NOTE — Telephone Encounter (Signed)
Please f/u

## 2016-02-04 ENCOUNTER — Other Ambulatory Visit: Payer: Self-pay | Admitting: Emergency Medicine

## 2016-02-04 ENCOUNTER — Telehealth: Payer: Self-pay | Admitting: Emergency Medicine

## 2016-02-04 DIAGNOSIS — R413 Other amnesia: Secondary | ICD-10-CM

## 2016-02-04 NOTE — Progress Notes (Signed)
Call patient and let him know I made a referral to [facility] to evaluate him for memory issues.

## 2016-02-04 NOTE — Telephone Encounter (Signed)
Call patient and let him know referral has been placed to neurology.

## 2016-02-04 NOTE — Progress Notes (Signed)
Patient advised.

## 2016-02-11 ENCOUNTER — Ambulatory Visit (INDEPENDENT_AMBULATORY_CARE_PROVIDER_SITE_OTHER): Payer: Federal, State, Local not specified - PPO

## 2016-02-11 ENCOUNTER — Ambulatory Visit (INDEPENDENT_AMBULATORY_CARE_PROVIDER_SITE_OTHER): Payer: Federal, State, Local not specified - PPO | Admitting: Neurology

## 2016-02-11 ENCOUNTER — Encounter: Payer: Self-pay | Admitting: Neurology

## 2016-02-11 VITALS — BP 129/89 | HR 69 | Ht 70.0 in | Wt 188.6 lb

## 2016-02-11 DIAGNOSIS — F0789 Other personality and behavioral disorders due to known physiological condition: Secondary | ICD-10-CM

## 2016-02-11 DIAGNOSIS — F05 Delirium due to known physiological condition: Secondary | ICD-10-CM | POA: Diagnosis not present

## 2016-02-11 DIAGNOSIS — F09 Unspecified mental disorder due to known physiological condition: Secondary | ICD-10-CM | POA: Diagnosis not present

## 2016-02-11 DIAGNOSIS — R410 Disorientation, unspecified: Secondary | ICD-10-CM

## 2016-02-11 NOTE — Patient Instructions (Addendum)
Remember to drink plenty of fluid, eat healthy meals and do not skip any meals. Try to eat protein with a every meal and eat a healthy snack such as fruit or nuts in between meals. Try to keep a regular sleep-wake schedule and try to exercise daily, particularly in the form of walking, 20-30 minutes a day, if you can.   As far as diagnostic testing: Labs, MRI brain, Neurocognitive testing   Please call us with any interim questions, concerns, problems, updates or refill requests.   Please also call us for any test results so we can go over those with you on the phone.  My clinical assistant and will answer any of your questions and relay your messages to me and also relay most of my messages to you.   Our phone number is [phone]. We also have an after hours call service for urgent matters and there is a physician on-call for urgent questions. For any emergencies you know to call 911 or go to the nearest emergency room

## 2016-02-11 NOTE — Progress Notes (Signed)
[facility]    Provider:  Dr Jaynee Eagles Referring Provider: Darlyne Russian, MD Primary Care Physician:  Jenny Reichmann, MD  CC:  Memory changes   HPI:  Drew OLSHEFSKI is a 55 y.o. male here as a referral from Dr. Everlene Farrier for cognitive changes. PMHx HTN, HLD, headaches.  Memory changes noticeable to patient and his wife as well. Simple things like forgetting names of footbal players, he travels frequently and difficulty reading and retaining information, work hasn't noticed. He forgets people he met. He asks his wife 20x times the same thing. He doesn't remmeber his latest TV show updates. Father had Lewy Body Dementia. Recent and remote memories both impaired. 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 Symptoms ongoing for the past year and possibly worsening. No inciting events, no head trauma or anything he can point to. He denies hallucinations or delusions. Brother has many psychiatric issues. Difficulty recalling doctors names he has been to see for years. Father was in his late 44s when diagnosed and when symptoms started. No new medications. He has occ issues with insomnia but overall denies mood problems or recent stressors. He had a bordelrine sleep test in the past but has since lost weight, no significant  snoring, no excessive tiredness during the day. No other focal neurologic deficits or complaints, associated symptoms or modifying factors.   Reviewed notes, labs and imaging from outside physicians, which showed:  B12, tsh, hiv, hepc all normal  CT HEAD WITHOUT CONTRAST (personally reviewed images and agree with the following):  TECHNIQUE: Contiguous axial images were obtained from the base of the skull through the vertex without intravenous contrast.  COMPARISON:  None.  FINDINGS: Ventricle size is normal. Negative for acute or chronic  infarction. Negative for hemorrhage or fluid collection. Negative for mass or edema. No shift of the midline structures.  Calvarium is intact.  IMPRESSION: Normal    Review of Systems: Patient complains of symptoms per HPI as well as the following symptoms:memory loss, headache, dizziness, easy bruising. Pertinent negatives per HPI. All others negative.   Social History   Social History  . Marital status: Married    Spouse name: Marliss Coots  . Number of children: 3  . Years of education: Bachelor's   Occupational History  . Revenue Agent [employer]   Social History Main Topics  . Smoking status: Former Smoker    Years: 5.00    Types: Cigarettes    Quit date: 05/31/1980  . Smokeless tobacco: Never Used  . Alcohol use No  . Drug use: No  . Sexual activity: Yes   Other Topics Concern  . Not on file   Social History Narrative   Lives with wife   Caffeine use: 3-4 diet cokes per day   Married. Education: [employer]. Exercise: No    Family History  Problem Relation Age of Onset  . Diverticulitis Mother   . Other Mother     Brain tumor  . Dementia Father   . Epilepsy Son   . Hyperlipidemia Brother   . Mental illness Brother     Past Medical History:  Diagnosis Date  . Allergy   . Hyperlipidemia   . Hypertension     Past Surgical History:  Procedure Laterality Date  . SHOULDER SURGERY  2013  . VASECTOMY      Current Outpatient  Prescriptions  Medication Sig Dispense Refill  . aspirin EC 81 MG tablet Take 81 mg by mouth daily.    Marland Kitchen atorvastatin (LIPITOR) 40 MG tablet Take 1 tablet (40 mg total) by mouth daily. 90 tablet 3  . Cholecalciferol (VITAMIN D) 400 UNITS capsule Take 400 Units by mouth daily.    . fish oil-omega-3 fatty acids 1000 MG capsule Take 2 g by mouth daily.    Marland Kitchen lisinopril (PRINIVIL,ZESTRIL) 20 MG tablet take 1 tablet by mouth every morning and 1/2 to 1 tablet by mouth at bedtime for blood pressure 180 tablet 3  . Multiple Vitamin (MULTIVITAMIN)  tablet Take 1 tablet by mouth daily.    Marland Kitchen omeprazole (PRILOSEC) 40 MG capsule Take 1 capsule (40 mg total) by mouth daily. 90 capsule 3   No current facility-administered medications for this visit.     Allergies as of 02/11/2016 - Review Complete 02/11/2016  Allergen Reaction Noted  . Anesthetics, ester Other (See Comments) 02/01/2012    Vitals: BP 129/89 (BP Location: Right Arm, Patient Position: Sitting, Cuff Size: Normal)   Pulse 69   Ht '5\' 10"'  (1.778 m)   Wt 188 lb 9.6 oz (85.5 kg)   BMI 27.06 kg/m  Last Weight:  Wt Readings from Last 1 Encounters:  02/11/16 188 lb 9.6 oz (85.5 kg)   Last Height:   Ht Readings from Last 1 Encounters:  02/11/16 '5\' 10"'  (1.778 m)     Physical exam: Exam: Gen: NAD, conversant, well nourised, well groomed                     CV: RRR, no MRG. No Carotid Bruits. No peripheral edema, warm, nontender Eyes: Conjunctivae clear without exudates or hemorrhage  Neuro: Detailed Neurologic Exam  Speech:    Speech is normal; fluent and spontaneous with normal comprehension.  Cognition:  MMSE - Mini Mental State Exam 02/11/2016  Orientation to time 5  Orientation to Place 5  Registration 3  Attention/ Calculation 5  Recall 2  Language- name 2 objects 2  Language- repeat 1  Language- follow 3 step command 3  Language- read & follow direction 1  Write a sentence 1  Copy design 1  Total score 29      The patient is oriented to person, place, and time;     recent and remote memory intact;     language fluent;     normal attention, concentration,     fund of knowledge Cranial Nerves:    The pupils are equal, round, and reactive to light. The fundi are normal and spontaneous venous pulsations are present. Visual fields are full to finger confrontation. Extraocular movements are intact. Trigeminal sensation is intact and the muscles of mastication are normal. The face is symmetric. The palate elevates in the midline. Hearing intact. Voice is  normal. Shoulder shrug is normal. The tongue has normal motion without fasciculations.   Coordination:    Normal finger to nose and heel to shin. Normal rapid alternating movements.   Gait:    Heel-toe and tandem gait are normal.   Motor Observation:    No asymmetry, no atrophy, and no involuntary movements noted. Tone:    Normal muscle tone.    Posture:    Posture is normal. normal erect    Strength:    Strength is V/V in the upper and lower limbs.      Sensation: intact to LT     Reflex Exam:  DTR's:  Deep tendon reflexes in the upper and lower extremities are normal bilaterally.   Toes:    The toes are downgoing bilaterally.   Clonus:    Clonus is absent.     Assessment/Plan:  55 year old male here for cognitive complaints. MMSE 29/30. Neurologic exam normal. I suspect this is normal cognitive aging in someone who is very high functioning.   B12 and RPR labs today MRI of the brain w/wo contrast Formal Neurocognitive testing with Dr. Vikki Ports in [facility]  Cc: Dr. Jerene Canny, [facility] [address] [place], [place] [address]  Phone [phone] Fax [phone]

## 2016-02-12 LAB — B12 AND FOLATE PANEL
Folate: >20 ng/mL (ref >3.0)
Vitamin B-12: 557 pg/mL (ref 211–946)

## 2016-02-12 LAB — RPR: RPR Ser Ql: NONREACTIVE

## 2016-02-12 MED ORDER — GADOPENTETATE DIMEGLUMINE 469.01 MG/ML IV SOLN: 18.0000 mL | Freq: Once | INTRAVENOUS | Status: DC | PRN

## 2016-02-13 ENCOUNTER — Telehealth: Payer: Self-pay | Admitting: *Deleted

## 2016-02-13 NOTE — Telephone Encounter (Signed)
Called and spoke to patient about normal labs per Dr Lucia GaskinsAhern. He verbalized understanding and has no further questions at this time.

## 2016-02-13 NOTE — Telephone Encounter (Signed)
-----   Message from Anson FretAntonia B Ahern, MD sent at 02/13/2016  9:48 AM EDT ----- MRi normal for age thanks

## 2016-02-13 NOTE — Telephone Encounter (Signed)
-----   Message from Anson FretAntonia B Ahern, MD sent at 02/12/2016  5:04 PM EDT ----- Labs normal

## 2016-02-13 NOTE — Telephone Encounter (Signed)
Called and spoke to pt about normal MRI for age per Dr Lucia GaskinsAhern. He verbalized understanding and stated he also saw on mychart. I gave him Dr. Jacquelyne BalintMcDermott telephone [phone] to follow up if he does not hear about referral.

## 2016-03-18 DIAGNOSIS — K080 Exfoliation of teeth due to systemic causes: Secondary | ICD-10-CM | POA: Diagnosis not present

## 2016-04-14 DIAGNOSIS — H35033 Hypertensive retinopathy, bilateral: Secondary | ICD-10-CM | POA: Diagnosis not present

## 2016-04-14 DIAGNOSIS — R413 Other amnesia: Secondary | ICD-10-CM | POA: Diagnosis not present

## 2016-05-19 DIAGNOSIS — M7541 Impingement syndrome of right shoulder: Secondary | ICD-10-CM | POA: Diagnosis not present

## 2016-07-18 IMAGING — CT CT HEAD W/O CM
2 series · 16 of 30 positions shown, 20 images · non-contrast
Comparison: None.

CLINICAL DATA: Persistent headaches

EXAM:
CT HEAD WITHOUT CONTRAST
TECHNIQUE: Contiguous axial images were obtained from the base of the skull
through the vertex without intravenous contrast.

[Series 3: head bone · axial · 0.49mm/px · z∈[+4,+44]mm · 3 of 28 slices shown]
[im 2/28  bone]
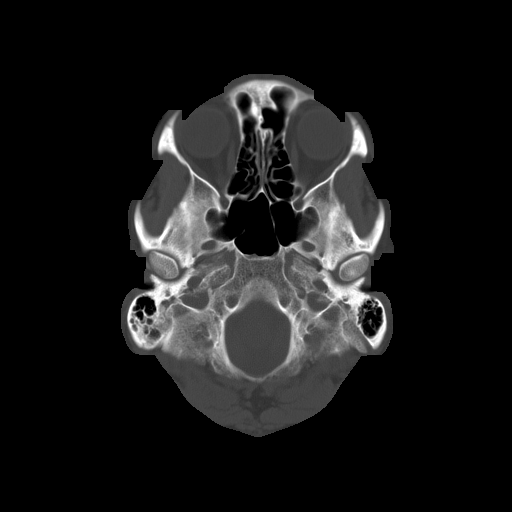
[im 6/28  bone]
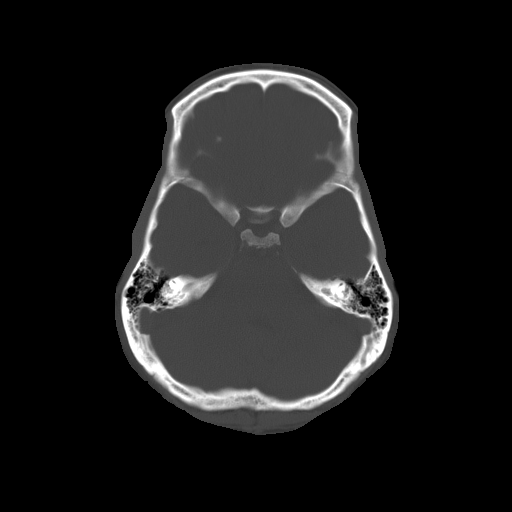
[im 10/28  bone]
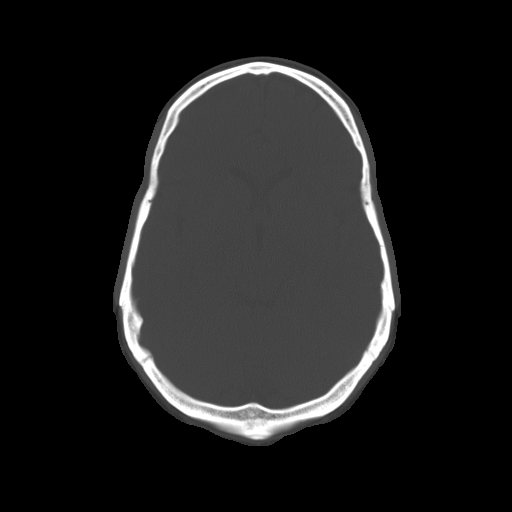

[Series 32: 3d filtered head w/o · axial · non-contrast · 0.49mm/px · z∈[+4,+124]mm · 13 of 28 slices shown, 17 images]
[im 2/28  brain]
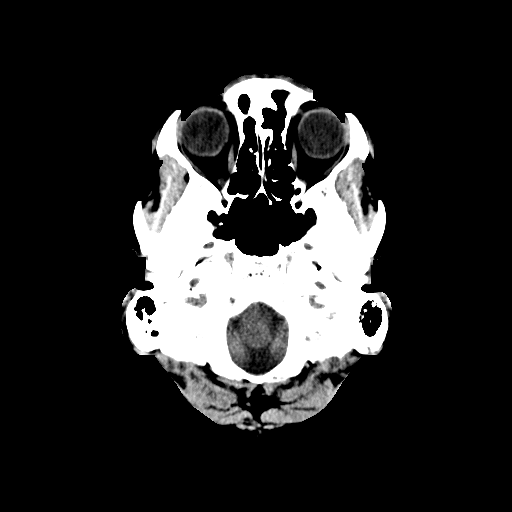
[im 2/28  bone]
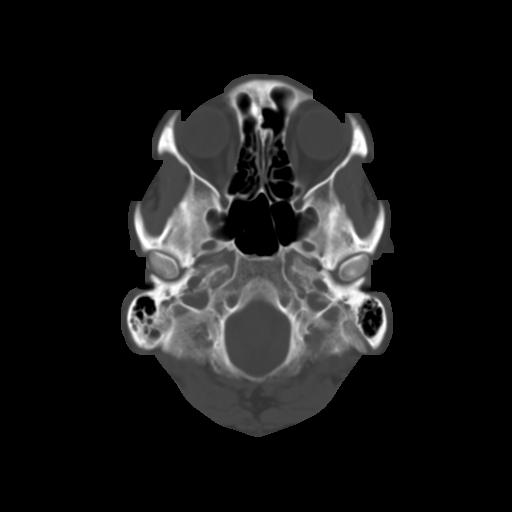
[im 4/28  brain]
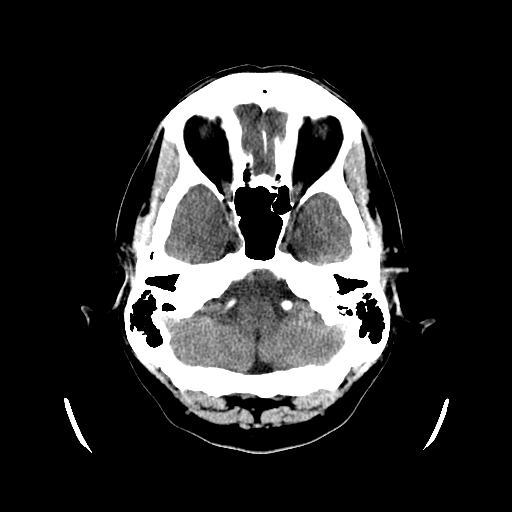
[im 6/28  brain]
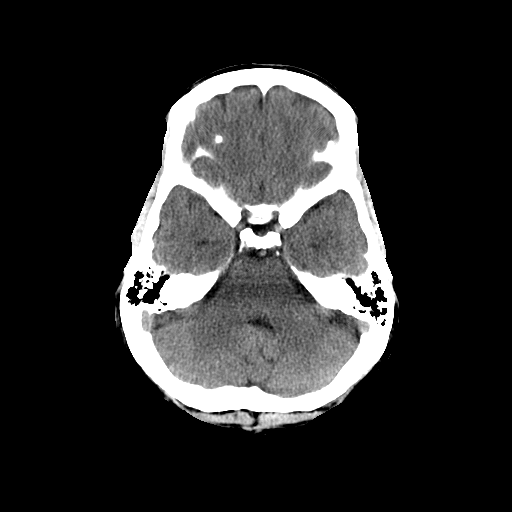
[im 8/28  brain]
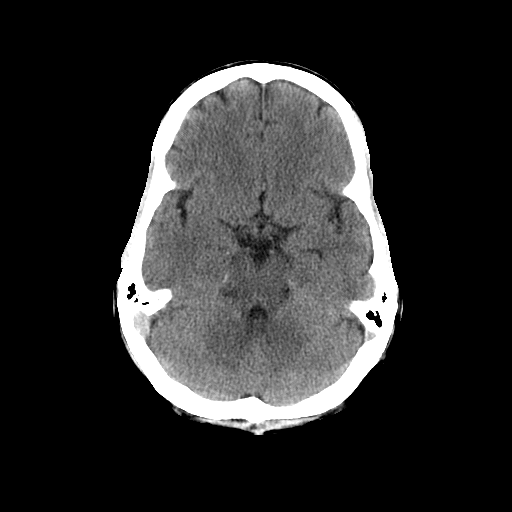
[im 10/28  brain]
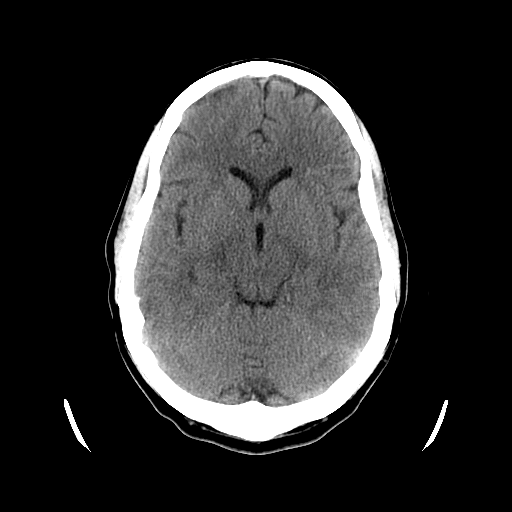
[im 10/28  bone]
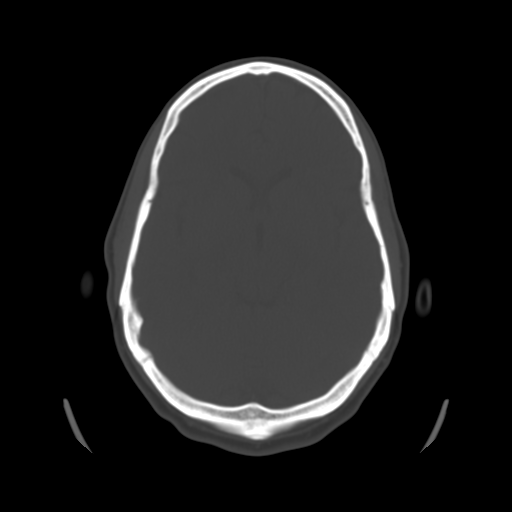
[im 12/28  brain]
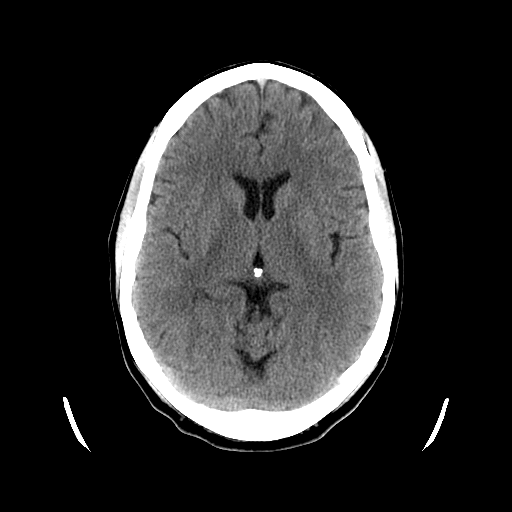
[im 14/28  brain]
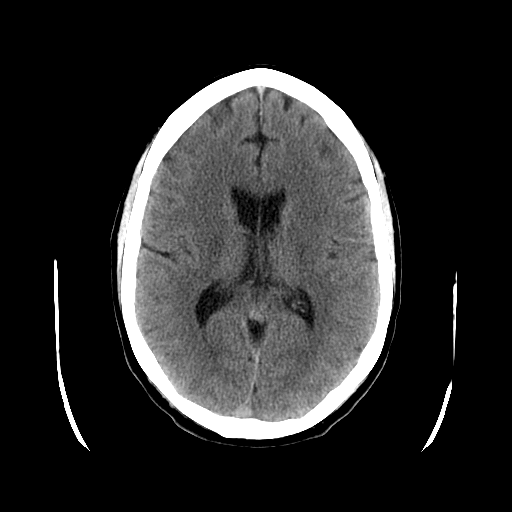
[im 16/28  brain]
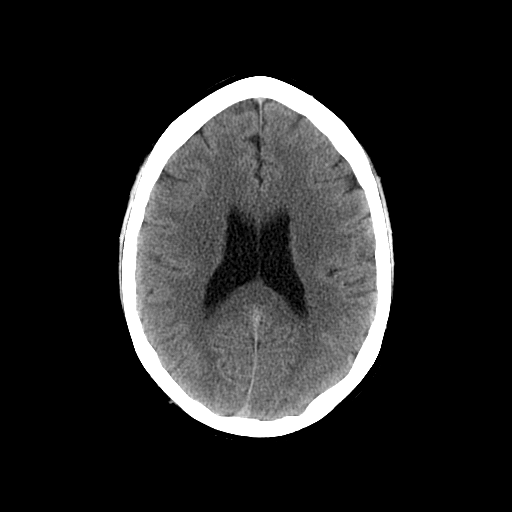
[im 18/28  brain]
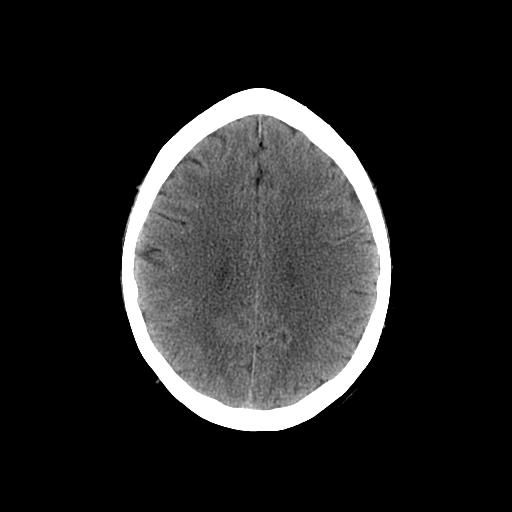
[im 18/28  bone]
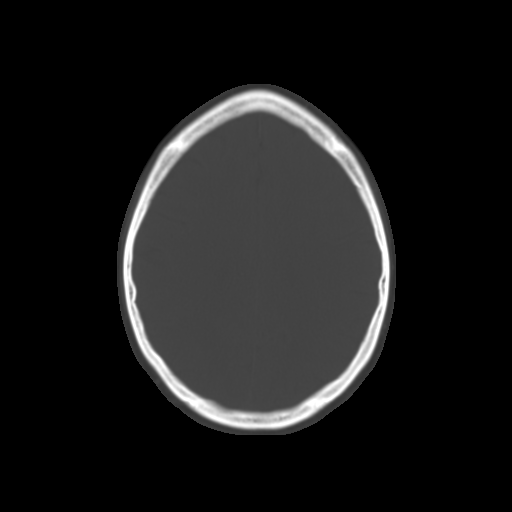
[im 20/28  brain]
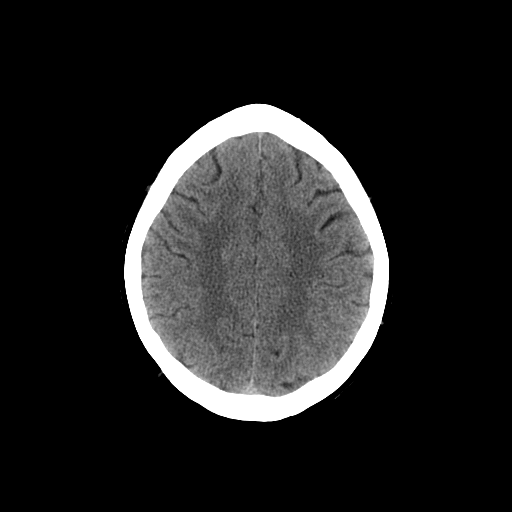
[im 22/28  brain]
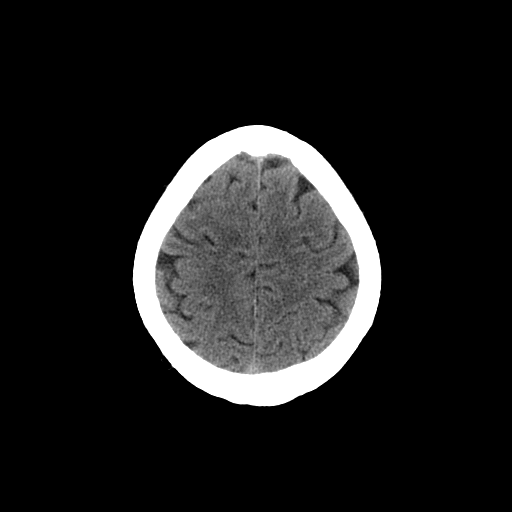
[im 24/28  brain]
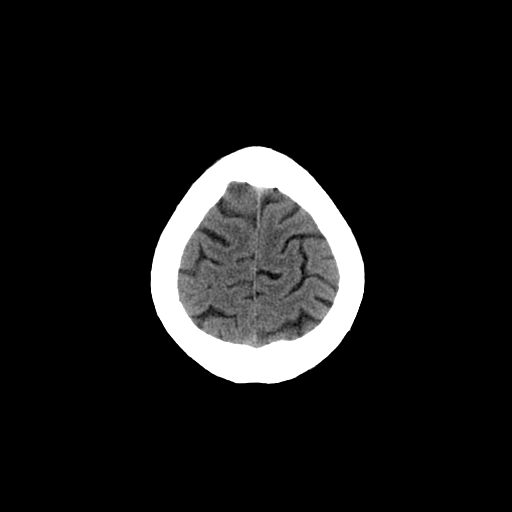
[im 26/28  brain]
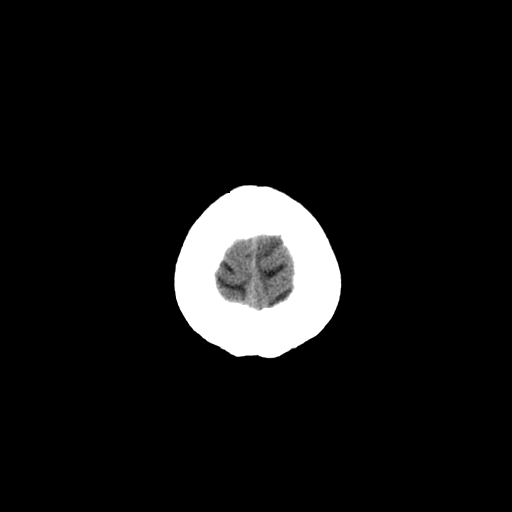
[im 26/28  bone]
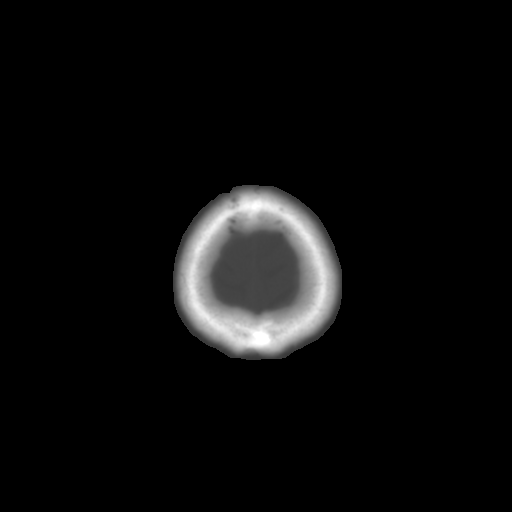

[16 of 30 positions shown; findings below may reference images not displayed]

FINDINGS: Ventricle size is normal. Negative for acute or chronic infarction.
Negative for hemorrhage or fluid collection. Negative for mass or
edema. No shift of the midline structures.

Calvarium is intact.
IMPRESSION: Normal

## 2016-07-19 DIAGNOSIS — M7541 Impingement syndrome of right shoulder: Secondary | ICD-10-CM | POA: Diagnosis not present

## 2016-07-23 DIAGNOSIS — M7541 Impingement syndrome of right shoulder: Secondary | ICD-10-CM | POA: Diagnosis not present

## 2016-08-06 DIAGNOSIS — M7541 Impingement syndrome of right shoulder: Secondary | ICD-10-CM | POA: Diagnosis not present

## 2016-08-10 DIAGNOSIS — M7541 Impingement syndrome of right shoulder: Secondary | ICD-10-CM | POA: Diagnosis not present

## 2016-08-13 DIAGNOSIS — M7541 Impingement syndrome of right shoulder: Secondary | ICD-10-CM | POA: Diagnosis not present

## 2016-08-17 DIAGNOSIS — M7541 Impingement syndrome of right shoulder: Secondary | ICD-10-CM | POA: Diagnosis not present

## 2016-08-19 DIAGNOSIS — M7541 Impingement syndrome of right shoulder: Secondary | ICD-10-CM | POA: Diagnosis not present

## 2016-08-23 DIAGNOSIS — S43431D Superior glenoid labrum lesion of right shoulder, subsequent encounter: Secondary | ICD-10-CM | POA: Diagnosis not present

## 2016-08-23 DIAGNOSIS — M7501 Adhesive capsulitis of right shoulder: Secondary | ICD-10-CM | POA: Diagnosis not present

## 2016-08-23 DIAGNOSIS — M7541 Impingement syndrome of right shoulder: Secondary | ICD-10-CM | POA: Diagnosis not present

## 2016-08-24 DIAGNOSIS — M7541 Impingement syndrome of right shoulder: Secondary | ICD-10-CM | POA: Diagnosis not present

## 2016-08-26 DIAGNOSIS — M7541 Impingement syndrome of right shoulder: Secondary | ICD-10-CM | POA: Diagnosis not present

## 2016-08-31 DIAGNOSIS — M7541 Impingement syndrome of right shoulder: Secondary | ICD-10-CM | POA: Diagnosis not present

## 2016-09-02 DIAGNOSIS — M7541 Impingement syndrome of right shoulder: Secondary | ICD-10-CM | POA: Diagnosis not present

## 2016-09-07 DIAGNOSIS — M7541 Impingement syndrome of right shoulder: Secondary | ICD-10-CM | POA: Diagnosis not present

## 2016-09-09 DIAGNOSIS — M7541 Impingement syndrome of right shoulder: Secondary | ICD-10-CM | POA: Diagnosis not present

## 2016-09-14 DIAGNOSIS — M7541 Impingement syndrome of right shoulder: Secondary | ICD-10-CM | POA: Diagnosis not present

## 2016-09-16 ENCOUNTER — Ambulatory Visit (INDEPENDENT_AMBULATORY_CARE_PROVIDER_SITE_OTHER): Payer: Federal, State, Local not specified - PPO | Admitting: Family Medicine

## 2016-09-16 ENCOUNTER — Encounter: Payer: Self-pay | Admitting: Family Medicine

## 2016-09-16 VITALS — BP 99/64 | HR 75 | Temp 98.4°F | Resp 16 | Ht 70.0 in | Wt 165.0 lb

## 2016-09-16 DIAGNOSIS — Z5181 Encounter for therapeutic drug level monitoring: Secondary | ICD-10-CM

## 2016-09-16 DIAGNOSIS — Z0000 Encounter for general adult medical examination without abnormal findings: Secondary | ICD-10-CM

## 2016-09-16 DIAGNOSIS — I10 Essential (primary) hypertension: Secondary | ICD-10-CM

## 2016-09-16 DIAGNOSIS — R5383 Other fatigue: Secondary | ICD-10-CM

## 2016-09-16 DIAGNOSIS — Z125 Encounter for screening for malignant neoplasm of prostate: Secondary | ICD-10-CM

## 2016-09-16 DIAGNOSIS — K219 Gastro-esophageal reflux disease without esophagitis: Secondary | ICD-10-CM | POA: Diagnosis not present

## 2016-09-16 DIAGNOSIS — E785 Hyperlipidemia, unspecified: Secondary | ICD-10-CM

## 2016-09-16 DIAGNOSIS — R7303 Prediabetes: Secondary | ICD-10-CM

## 2016-09-16 DIAGNOSIS — M7541 Impingement syndrome of right shoulder: Secondary | ICD-10-CM | POA: Diagnosis not present

## 2016-09-16 DIAGNOSIS — K080 Exfoliation of teeth due to systemic causes: Secondary | ICD-10-CM | POA: Diagnosis not present

## 2016-09-16 MED ORDER — ATORVASTATIN CALCIUM 40 MG PO TABS: 40.0000 mg | ORAL_TABLET | Freq: Every day | ORAL | 3 refills | Status: DC

## 2016-09-16 MED ORDER — OMEPRAZOLE 20 MG PO CPDR: 20.0000 mg | DELAYED_RELEASE_CAPSULE | Freq: Every day | ORAL | 3 refills | Status: DC

## 2016-09-16 MED ORDER — LISINOPRIL 20 MG PO TABS: 20.0000 mg | ORAL_TABLET | Freq: Every day | ORAL | 3 refills | Status: DC

## 2016-09-16 NOTE — Patient Instructions (Addendum)
Decrease lisinopril to  each day.  That should improve fatigue and blood pressure. If blood pressure remains over 140/90 - let me know and can add back in some meds.   Omeprazole up to once per day. Try the  dose.   If cholesterol is much improved, may be able to try 1/2 dose.  Will let you know once we see the results.      Food Choices for Gastroesophageal Reflux Disease, Adult When you have gastroesophageal reflux disease (GERD), the foods you eat and your eating habits are very important. Choosing the right foods can help ease the discomfort of GERD. Consider working with a diet and nutrition specialist (dietitian) to help you make healthy food choices. What general guidelines should I follow? Eating plan   Choose healthy foods low in fat, such as fruits, vegetables, whole grains, low-fat dairy products, and lean meat, fish, and poultry.  Eat frequent, small meals instead of three large meals each day. Eat your meals slowly, in a relaxed setting. Avoid bending over or lying down until 2-3 hours after eating.  Limit high-fat foods such as fatty meats or fried foods.  Limit your intake of oils, butter, and shortening to less than 8 teaspoons each day.  Avoid the following:  Foods that cause symptoms. These may be different for different people. Keep a food diary to keep track of foods that cause symptoms.  Alcohol.  Drinking large amounts of liquid with meals.  Eating meals during the 2-3 hours before bed.  Cook foods using methods other than frying. This may include baking, grilling, or broiling. Lifestyle    Maintain a healthy weight. Ask your health care provider what weight is healthy for you. If you need to lose weight, work with your health care provider to do so safely.  Exercise for at least 30 minutes on 5 or more days each week, or as told by your health care provider.  Avoid wearing clothes that fit tightly around your waist and chest.  Do not use any  products that contain nicotine or tobacco, such as cigarettes and e-cigarettes. If you need help quitting, ask your health care provider.  Sleep with the head of your bed raised. Use a wedge under the mattress or blocks under the bed frame to raise the head of the bed. What foods are not recommended? The items listed may not be a complete list. Talk with your dietitian about what dietary choices are best for you. Grains  Pastries or quick breads with added fat. [place] toast. Vegetables  Deep fried vegetables. [place] fries. Any vegetables prepared with added fat. Any vegetables that cause symptoms. For some people this may include tomatoes and tomato products, chili peppers, onions and garlic, and horseradish. Fruits  Any fruits prepared with added fat. Any fruits that cause symptoms. For some people this may include citrus fruits, such as oranges, grapefruit, pineapple, and lemons. Meats and other protein foods  High-fat meats, such as fatty beef or pork, hot dogs, ribs, ham, sausage, salami and bacon. Fried meat or protein, including fried fish and fried chicken. Nuts and nut butters. Dairy  Whole milk and chocolate milk. Sour cream. Cream. Ice cream. Cream cheese. Milk shakes. Beverages  Coffee and tea, with or without caffeine. Carbonated beverages. Sodas. Energy drinks. Fruit juice made with acidic fruits (such as orange or grapefruit). Tomato juice. Alcoholic drinks. Fats and oils  Butter. Margarine. Shortening. Ghee. Sweets and desserts  Chocolate and cocoa. Donuts. Seasoning and other foods  Pepper. Peppermint and spearmint. Any condiments, herbs, or seasonings that cause symptoms. For some people, this may include curry, hot sauce, or vinegar-based salad dressings. Summary  When you have gastroesophageal reflux disease (GERD), food and lifestyle choices are very important to help ease the discomfort of GERD.  Eat frequent, small meals instead of three large meals each day. Eat your  meals slowly, in a relaxed setting. Avoid bending over or lying down until 2-3 hours after eating.  Limit high-fat foods such as fatty meat or fried foods. This information is not intended to replace advice given to you by your health care provider. Make sure you discuss any questions you have with your health care provider. Document Released: 05/17/2005 Document Revised: 05/18/2016 Document Reviewed: 05/18/2016 Elsevier Interactive Patient Education  2017 [employer].   Keeping you healthy  Get these tests  Blood pressure- Have your blood pressure checked once a year by your healthcare provider.  Normal blood pressure is 120/80  Weight- Have your body mass index (BMI) calculated to screen for obesity.  BMI is a measure of body fat based on height and weight. You can also calculate your own BMI at [email or web address].  Cholesterol- Have your cholesterol checked every year.  Diabetes- Have your blood sugar checked regularly if you have high blood pressure, high cholesterol, have a family history of diabetes or if you are overweight.  Screening for Colon Cancer- Colonoscopy starting at age 18.  Screening may begin sooner depending on your family history and other health conditions. Follow up colonoscopy as directed by your Gastroenterologist.  Screening for Prostate Cancer- Both blood work (PSA) and a rectal exam help screen for Prostate Cancer.  Screening begins at age 59 with African-American men and at age 48 with Caucasian men.  Screening may begin sooner depending on your family history.  Take these medicines  Aspirin- One aspirin daily can help prevent Heart disease and Stroke.  Flu shot- Every fall.  Tetanus- Every 10 years.  Zostavax- Once after the age of 59 to prevent Shingles.  Pneumonia shot- Once after the age of 24; if you are younger than 47, ask your healthcare provider if you need a Pneumonia shot.  Take these steps  Don't smoke- If you do smoke, talk to  your doctor about quitting.  For tips on how to quit, go to www.smokefree.gov or call 1-800-QUIT-NOW.  Be physically active- Exercise 5 days a week for at least 30 minutes.  If you are not already physically active start slow and gradually work up to 30 minutes of moderate physical activity.  Examples of moderate activity include walking briskly, mowing the yard, dancing, swimming, bicycling, etc.  Eat a healthy diet- Eat a variety of healthy food such as fruits, vegetables, low fat milk, low fat cheese, yogurt, lean meant, poultry, fish, beans, tofu, etc. For more information go to www.thenutritionsource.org  Drink alcohol in moderation- Limit alcohol intake to less than two drinks a day. Never drink and drive.  Dentist- Brush and floss twice daily; visit your dentist twice a year.  Depression- Your emotional health is as important as your physical health. If you're feeling down, or losing interest in things you would normally enjoy please talk to your healthcare provider.  Eye exam- Visit your eye doctor every year.  Safe sex- If you may be exposed to a sexually transmitted infection, use a condom.  Seat belts- Seat belts can save your life; always wear one.  Smoke/Carbon Monoxide detectors- These detectors need to be  installed on the appropriate level of your home.  Replace batteries at least once a year.  Skin cancer- When out in the sun, cover up and use sunscreen 15 SPF or higher.  Violence- If anyone is threatening you, please tell your healthcare provider.  Living Will/ Health care power of attorney- Speak with your healthcare provider and family.    IF you received an x-ray today, you will receive an invoice from [facility]. Please contact [facility] at [phone] with questions or concerns regarding your invoice.   IF you received labwork today, you will receive an invoice from [facility]. Please contact [facility] at [phone] with questions or concerns  regarding your invoice.   Our billing staff will not be able to assist you with questions regarding bills from these companies.  You will be contacted with the lab results as soon as they are available. The fastest way to get your results is to activate your My Chart account. Instructions are located on the last page of this paperwork. If you have not heard from [place] regarding the results in 2 weeks, please contact this office.

## 2016-09-16 NOTE — Progress Notes (Signed)
By signing my name below, I, Mesha Guinyard, attest that this documentation has been prepared under the direction and in the presence of Shanika Levings, MD.  Electronically Signed: Arvilla Market, Medical Scribe. 09/16/16. 9:32 AM.  Subjective:    Patient ID:Drew Miles, male    DOB: May 14, 1961, 56 y.o.   MRN: [id number]  HPI Chief Complaint  Patient presents with  . Annual Exam    HPI Comments: Drew Miles is a 56 y.o. male who presents to the Primary Care at [facility] for his annual physical. Drew Miles has a PMHx of HTN, HLD, reflux, and obesity. Previous pt of Dr. Cleta Alberts and est. with me. He was also seen by neurology Sept 2017 for cognitive complaints. Thought to be nl cognitive aging, but was referred for formal neurocognitive testing that was performed in Nov 2017. There were no findings of cognitive disorder and again, thought to be nl age related changes. He had nl lab work and MRI for age by Dr. Lucia Gaskins. Pt is fasting. Reports having trouble with long term memory. Pt has rehab for his shoulder later today.  HTN: He's on lisinopril 20 mg BID. Pt mentions his bp is usually not as low as it is today in the office. Pt is compliant with lisinopril. Reports fatigue and light-headedness when stands up fast. Denies chest pain, and SOB. Lab Results  Component Value Date   CREATININE 1.03 09/18/2015   Wt Readings from Last 3 Encounters:  09/16/16 165 lb (74.8 kg)  02/11/16 188 lb 9.6 oz (85.5 kg)  09/18/15 202 lb 9.6 oz (91.9 kg)   BP Readings from Last 3 Encounters:  09/16/16 99/64  02/11/16 129/89  09/18/15 130/82   HLD: Takes lipitor 40 mg QD. Compliant with lipitor and denies experiencing any acute side effects from this medication. Lab Results  Component Value Date   CHOL 139 09/18/2015   HDL 38 (L) 09/18/2015   LDLCALC 64 09/18/2015   TRIG 183 (H) 09/18/2015   CHOLHDL 3.7 09/18/2015   Lab Results  Component Value Date   ALT 13 09/18/2015   AST 13  09/18/2015   ALKPHOS 55 09/18/2015   BILITOT 0.6 09/18/2015   PreDM: Has lost weight as above. Lab Results  Component Value Date   HGBA1C 6.0 (H) 09/18/2015   Reflux: He has taken omeprazole 40 mg QD. Pt is complaint with omeprazole 40 mg QD, he has always been at the 40 mg dosage. Reports a small ulcer in the past, and pain "in the gut" in the past. Denies experiencing any breakthrough heartburn sxs.  ROS Complaints: Appetite Change: Pt has been exercising and watching what he eats. Pt is not eating grapefruit, but he would like to. Fatigue: Denies chest pain, syncope, and SOB. Light-headedness: Occurs when he gets up too fast. Denies chest pain, syncope, and SOB.  Cancer Screening: Prostate: Pt agrees to screening with DRE and blood work. Lab Results  Component Value Date   PSA 0.28 09/18/2015   PSA 0.30 10/31/2014   PSA 0.33 12/18/2013  Colon: Colonoscopy was 2017 at [facility], reportedly nl. Pt has had removed part of his bowel in the past. FHx: 3-4 grandparents died from colon CA.  Hep C/HIV Screening: Completed April 2017.  Immunizations: Immunization History  Administered Date(s) Administered  . Influenza Split 02/25/2012  . Influenza,inj,Quad PF,36+ Mos 02/06/2013, 02/25/2014, 02/16/2015  . Tdap 11/11/2009   Vision: Pt is followed by ophthalmologist and was last seen Dec 2017.  He had a spot on his eye that's being monitored. Denies PMHx of glaucoma.  Visual Acuity Screening   Right eye Left eye Both eyes  Without correction:     With correction:   Dentist: Is followed by a dentist and has an appt later today.  Exercise: Pt has been exercising daily. Pt has rehab for his shoulder later today.  Depression Screening: Depression screen [facility] 2/9 09/16/2016 09/18/2015 10/31/2014  Decreased Interest 0 0 0  Down, Depressed, Hopeless 0 0 0  PHQ - 2 Score 0 0 0   Patient Active Problem List   Diagnosis Date Noted  . Persistent headaches 10/31/2014    . Obesity, unspecified 02/06/2013  . Hypertension 02/01/2012  . Hyperlipidemia 02/01/2012  . Reflux 02/01/2012  . Abnormal CT scan 02/01/2012   Past Medical History:  Diagnosis Date  . Allergy   . Hyperlipidemia   . Hypertension    Past Surgical History:  Procedure Laterality Date  . SHOULDER SURGERY  2013  . VASECTOMY     Allergies  Allergen Reactions  . Anesthetics, Ester Other (See Comments)    INCREASED BP   Prior to Admission medications   Medication Sig Start Date End Date Taking? Authorizing Provider  aspirin EC 81 MG tablet Take 81 mg by mouth daily.   Yes Historical Provider, MD  atorvastatin (LIPITOR) 40 MG tablet Take 1 tablet (40 mg total) by mouth daily. 09/18/15  Yes [name], MD  Cholecalciferol (VITAMIN D) 400 UNITS capsule Take 400 Units by mouth daily.   Yes Historical Provider, MD  fish oil-omega-3 fatty acids 1000 MG capsule Take 2 g by mouth daily.   Yes Historical Provider, MD  lisinopril (PRINIVIL,ZESTRIL) 20 MG tablet take 1 tablet by mouth every morning and 1/2 to 1 tablet by mouth at bedtime for blood pressure 09/18/15  Yes [name], MD  Multiple Vitamin (MULTIVITAMIN) tablet Take 1 tablet by mouth daily.   Yes Historical Provider, MD  omeprazole (PRILOSEC) 40 MG capsule Take 1 capsule (40 mg total) by mouth daily. 09/18/15  Yes [name], MD   Social History   Social History  . Marital status: Married    Spouse name: [name]  . Number of children: 3  . Years of education: Bachelor's   Occupational History  . [employer] Agent [employer]   Social History Main Topics  . Smoking status: Former Smoker    Years: 5.00    Types: Cigarettes    Quit date: 05/31/1980  . Smokeless tobacco: Never Used  . Alcohol use No  . Drug use: No  . Sexual activity: Yes   Other Topics Concern  . Not on file   Social History Narrative   Lives with wife   Caffeine use: 3-4 diet cokes per day   Married. Education: [employer]. Exercise: No   Review of Systems   Constitutional: Positive for appetite change and fatigue.  Neurological: Positive for light-headedness.  13 point ROS positive for the above. Objective:  Physical Exam  Constitutional: He is oriented to person, place, and time. He appears well-developed and well-nourished.  HENT:  Head: Normocephalic and atraumatic.  Right Ear: External ear normal.  Left Ear: External ear normal.  Mouth/Throat: Oropharynx is clear and moist.  Eyes: Conjunctivae and EOM are normal. Pupils are equal, round, and reactive to light.  Neck: Normal range of motion. Neck supple. No thyromegaly present.  Cardiovascular: Normal rate, regular rhythm, normal heart sounds and intact distal pulses.  Pulmonary/Chest: Effort normal and breath sounds normal. No respiratory distress. He has no wheezes.  Abdominal: Soft. He exhibits no distension. There is no tenderness. Hernia confirmed negative in the right inguinal area and confirmed negative in the left inguinal area.  Genitourinary: Prostate normal.  Musculoskeletal: Normal range of motion. He exhibits no edema or tenderness.  Lymphadenopathy:    He has no cervical adenopathy.  Neurological: He is alert and oriented to person, place, and time. He has normal reflexes.  Skin: Skin is warm and dry.  Psychiatric: He has a normal mood and affect. His behavior is normal.  Vitals reviewed.      Vitals:   09/16/16 0849  BP: 99/64  Pulse: 75  Resp: 16  Temp: 98.4 F (36.9 C)  TempSrc: Oral  SpO2: 98%  Weight: 165 lb (74.8 kg)  Height:  (1.778 m)  Body mass index is 23.68 kg/m.   Wt Readings from Last 3 Encounters:  09/16/16 165 lb (74.8 kg)  02/11/16 188 lb 9.6 oz (85.5 kg)  09/18/15 202 lb 9.6 oz (91.9 kg)      Assessment & Plan:  Drew Miles is a 56 y.o. male Annual physical exam  - -anticipatory guidance as below in AVS, screening labs above. Health maintenance items as above in HPI discussed/recommended as applicable.   Essential  hypertension - Plan: lisinopril (PRINIVIL,ZESTRIL) 20 MG tablet  - overtreated with weight changes likely and fatigue.   -decrease lisinopril to  QD.   - check CMP  Other fatigue - Plan: CBC, TSH  - likely due to low BP. Check CBC, TSH, but will try lower dose of lisinopril.     Hyperlipidemia, unspecified hyperlipidemia type - Plan: Lipid panel, Comprehensive metabolic panel, atorvastatin (LIPITOR) 40 MG tablet  - check labs, continue Lipitor same dose for now, but option of lower dose if well controlled as has lost weight.   Prediabetes - Plan: Hemoglobin A1c  - hyperglycemia by history. Check A1c.   Screening for prostate cancer - Plan: PSA  - We discussed pros and cons of prostate cancer screening, and after this discussion, he chose to have screening done. PSA obtained, and no concerning findings on DRE.   Gastroesophageal reflux disease, esophagitis presence not specified - Plan: Magnesium, omeprazole (PRILOSEC) 20 MG capsule  - trigger avoidance, continue omeprazole, but try lower dose of  QD. rtc precautions  Medication monitoring encounter - Plan: Magnesium  - with chronic PPI use. Recent B12 ok, check magnesium    Meds ordered this encounter  Medications  . atorvastatin (LIPITOR) 40 MG tablet    Sig: Take 1 tablet (40 mg total) by mouth daily.    Dispense:  90 tablet    Refill:  3  . lisinopril (PRINIVIL,ZESTRIL) 20 MG tablet    Sig: Take 1 tablet (20 mg total) by mouth daily. t    Dispense:  90 tablet    Refill:  3  . omeprazole (PRILOSEC) 20 MG capsule    Sig: Take 1 capsule (20 mg total) by mouth daily.    Dispense:  90 capsule    Refill:  3   Patient Instructions   Decrease lisinopril to  each day.  That should improve fatigue and blood pressure. If blood pressure remains over 140/90 - let me know and can add back in some meds.   Omeprazole up to once per day. Try the  dose.   If cholesterol is much improved, may be able to try 1/2 dose.  Will let you know once we see the results.      Food Choices for Gastroesophageal Reflux Disease, Adult When you have gastroesophageal reflux disease (GERD), the foods you eat and your eating habits are very important. Choosing the right foods can help ease the discomfort of GERD. Consider working with a diet and nutrition specialist (dietitian) to help you make healthy food choices. What general guidelines should I follow? Eating plan   Choose healthy foods low in fat, such as fruits, vegetables, whole grains, low-fat dairy products, and lean meat, fish, and poultry.  Eat frequent, small meals instead of three large meals each day. Eat your meals slowly, in a relaxed setting. Avoid bending over or lying down until 2-3 hours after eating.  Limit high-fat foods such as fatty meats or fried foods.  Limit your intake of oils, butter, and shortening to less than 8 teaspoons each day.  Avoid the following:  Foods that cause symptoms. These may be different for different people. Keep a food diary to keep track of foods that cause symptoms.  Alcohol.  Drinking large amounts of liquid with meals.  Eating meals during the 2-3 hours before bed.  Cook foods using methods other than frying. This may include baking, grilling, or broiling. Lifestyle    Maintain a healthy weight. Ask your health care provider what weight is healthy for you. If you need to lose weight, work with your health care provider to do so safely.  Exercise for at least 30 minutes on 5 or more days each week, or as told by your health care provider.  Avoid wearing clothes that fit tightly around your waist and chest.  Do not use any products that contain nicotine or tobacco, such as cigarettes and e-cigarettes. If you need help quitting, ask your health care provider.  Sleep with the head of your bed raised. Use a wedge under the mattress or blocks under the bed frame to raise the head of the bed. What foods are not  recommended? The items listed may not be a complete list. Talk with your dietitian about what dietary choices are best for you. Grains  Pastries or quick breads with added fat. [place] toast. Vegetables  Deep fried vegetables. [place] fries. Any vegetables prepared with added fat. Any vegetables that cause symptoms. For some people this may include tomatoes and tomato products, chili peppers, onions and garlic, and horseradish. Fruits  Any fruits prepared with added fat. Any fruits that cause symptoms. For some people this may include citrus fruits, such as oranges, grapefruit, pineapple, and lemons. Meats and other protein foods  High-fat meats, such as fatty beef or pork, hot dogs, ribs, ham, sausage, salami and bacon. Fried meat or protein, including fried fish and fried chicken. Nuts and nut butters. Dairy  Whole milk and chocolate milk. Sour cream. Cream. Ice cream. Cream cheese. Milk shakes. Beverages  Coffee and tea, with or without caffeine. Carbonated beverages. Sodas. Energy drinks. Fruit juice made with acidic fruits (such as orange or grapefruit). Tomato juice. Alcoholic drinks. Fats and oils  Butter. Margarine. Shortening. Ghee. Sweets and desserts  Chocolate and cocoa. Donuts. Seasoning and other foods  Pepper. Peppermint and spearmint. Any condiments, herbs, or seasonings that cause symptoms. For some people, this may include curry, hot sauce, or vinegar-based salad dressings. Summary  When you have gastroesophageal reflux disease (GERD), food and lifestyle choices are very important to help ease the discomfort of GERD.  Eat frequent, small meals instead of three large  meals each day. Eat your meals slowly, in a relaxed setting. Avoid bending over or lying down until 2-3 hours after eating.  Limit high-fat foods such as fatty meat or fried foods. This information is not intended to replace advice given to you by your health care provider. Make sure you discuss any questions  you have with your health care provider. Document Released: 05/17/2005 Document Revised: 05/18/2016 Document Reviewed: 05/18/2016 Elsevier Interactive Patient Education  2017 [employer].   Keeping you healthy  Get these tests  Blood pressure- Have your blood pressure checked once a year by your healthcare provider.  Normal blood pressure is 120/80  Weight- Have your body mass index (BMI) calculated to screen for obesity.  BMI is a measure of body fat based on height and weight. You can also calculate your own BMI at [email or web address].  Cholesterol- Have your cholesterol checked every year.  Diabetes- Have your blood sugar checked regularly if you have high blood pressure, high cholesterol, have a family history of diabetes or if you are overweight.  Screening for Colon Cancer- Colonoscopy starting at age 69.  Screening may begin sooner depending on your family history and other health conditions. Follow up colonoscopy as directed by your Gastroenterologist.  Screening for Prostate Cancer- Both blood work (PSA) and a rectal exam help screen for Prostate Cancer.  Screening begins at age 14 with African-American men and at age 73 with Caucasian men.  Screening may begin sooner depending on your family history.  Take these medicines  Aspirin- One aspirin daily can help prevent Heart disease and Stroke.  Flu shot- Every fall.  Tetanus- Every 10 years.  Zostavax- Once after the age of 27 to prevent Shingles.  Pneumonia shot- Once after the age of 82; if you are younger than 7, ask your healthcare provider if you need a Pneumonia shot.  Take these steps  Don't smoke- If you do smoke, talk to your doctor about quitting.  For tips on how to quit, go to www.smokefree.gov or call 1-800-QUIT-NOW.  Be physically active- Exercise 5 days a week for at least 30 minutes.  If you are not already physically active start slow and gradually work up to 30 minutes of moderate physical  activity.  Examples of moderate activity include walking briskly, mowing the yard, dancing, swimming, bicycling, etc.  Eat a healthy diet- Eat a variety of healthy food such as fruits, vegetables, low fat milk, low fat cheese, yogurt, lean meant, poultry, fish, beans, tofu, etc. For more information go to www.thenutritionsource.org  Drink alcohol in moderation- Limit alcohol intake to less than two drinks a day. Never drink and drive.  Dentist- Brush and floss twice daily; visit your dentist twice a year.  Depression- Your emotional health is as important as your physical health. If you're feeling down, or losing interest in things you would normally enjoy please talk to your healthcare provider.  Eye exam- Visit your eye doctor every year.  Safe sex- If you may be exposed to a sexually transmitted infection, use a condom.  Seat belts- Seat belts can save your life; always wear one.  Smoke/Carbon Monoxide detectors- These detectors need to be installed on the appropriate level of your home.  Replace batteries at least once a year.  Skin cancer- When out in the sun, cover up and use sunscreen 15 SPF or higher.  Violence- If anyone is threatening you, please tell your healthcare provider.  Living Will/ Health care power of attorney- Speak with your healthcare  provider and family.    IF you received an x-ray today, you will receive an invoice from [facility]. Please contact [facility] at [phone] with questions or concerns regarding your invoice.   IF you received labwork today, you will receive an invoice from [facility]. Please contact [facility] at [phone] with questions or concerns regarding your invoice.   Our billing staff will not be able to assist you with questions regarding bills from these companies.  You will be contacted with the lab results as soon as they are available. The fastest way to get your results is to activate your My Chart account.  Instructions are located on the last page of this paperwork. If you have not heard from [place] regarding the results in 2 weeks, please contact this office.       I personally performed the services described in this documentation, which was scribed in my presence. The recorded information has been reviewed and considered for accuracy and completeness, addended by me as needed, and agree with information above.  Signed,   Drew Staggers, MD Primary Care at [facility].  09/17/16 2:34 PM

## 2016-09-17 LAB — COMPREHENSIVE METABOLIC PANEL
ALT: 19 IU/L (ref 0–44)
AST: 20 IU/L (ref 0–40)
Albumin/Globulin Ratio: 2.4 — ABNORMAL HIGH (ref 1.2–2.2)
Albumin: 4.4 g/dL (ref 3.5–5.5)
Alkaline Phosphatase: 64 IU/L (ref 39–117)
BUN/Creatinine Ratio: 11 (ref 9–20)
BUN: 11 mg/dL (ref 6–24)
Bilirubin Total: 0.4 mg/dL (ref 0.0–1.2)
CO2: 25 mmol/L (ref 18–29)
Calcium: 9.9 mg/dL (ref 8.7–10.2)
Chloride: 100 mmol/L (ref 96–106)
Creatinine, Ser: 1 mg/dL (ref 0.76–1.27)
GFR calc Af Amer: 98 mL/min/{1.73_m2} (ref >59)
GFR calc non Af Amer: 85 mL/min/{1.73_m2} (ref >59)
Globulin, Total: 1.8 g/dL (ref 1.5–4.5)
Glucose: 93 mg/dL (ref 65–99)
Potassium: 4.6 mmol/L (ref 3.5–5.2)
Sodium: 142 mmol/L (ref 134–144)
Total Protein: 6.2 g/dL (ref 6.0–8.5)

## 2016-09-17 LAB — CBC
Hematocrit: 43.4 % (ref 37.5–51.0)
Hemoglobin: 14.4 g/dL (ref 13.0–17.7)
MCH: 27 pg (ref 26.6–33.0)
MCHC: 33.2 g/dL (ref 31.5–35.7)
MCV: 81 fL (ref 79–97)
Platelets: 196 10*3/uL (ref 150–379)
RBC: 5.33 x10E6/uL (ref 4.14–5.80)
RDW: 13.9 % (ref 12.3–15.4)
WBC: 6.3 10*3/uL (ref 3.4–10.8)

## 2016-09-17 LAB — HEMOGLOBIN A1C
Est. average glucose Bld gHb Est-mCnc: 108 mg/dL
Hgb A1c MFr Bld: 5.4 % (ref 4.8–5.6)

## 2016-09-17 LAB — LIPID PANEL
Chol/HDL Ratio: 2.8 ratio (ref 0.0–5.0)
Cholesterol, Total: 129 mg/dL (ref 100–199)
HDL: 46 mg/dL (ref >39)
LDL Calculated: 65 mg/dL (ref 0–99)
Triglycerides: 91 mg/dL (ref 0–149)
VLDL Cholesterol Cal: 18 mg/dL (ref 5–40)

## 2016-09-17 LAB — TSH: TSH: 1.75 u[IU]/mL (ref 0.450–4.500)

## 2016-09-17 LAB — MAGNESIUM: Magnesium: 2.2 mg/dL (ref 1.6–2.3)

## 2016-09-17 LAB — PSA: Prostate Specific Ag, Serum: 0.8 ng/mL (ref 0.0–4.0)

## 2016-09-17 NOTE — Addendum Note (Signed)
Addended by: Neva Seat, Atilano Covelli R on: 09/17/2016 04:00 PM   Modules accepted: Level of Service

## 2016-09-21 DIAGNOSIS — M7541 Impingement syndrome of right shoulder: Secondary | ICD-10-CM | POA: Diagnosis not present

## 2016-09-22 DIAGNOSIS — M7501 Adhesive capsulitis of right shoulder: Secondary | ICD-10-CM | POA: Diagnosis not present

## 2016-10-04 ENCOUNTER — Encounter: Payer: Self-pay | Admitting: Family Medicine

## 2017-02-21 ENCOUNTER — Ambulatory Visit (INDEPENDENT_AMBULATORY_CARE_PROVIDER_SITE_OTHER): Payer: Federal, State, Local not specified - PPO | Admitting: Family Medicine

## 2017-02-21 DIAGNOSIS — Z23 Encounter for immunization: Secondary | ICD-10-CM

## 2017-02-23 NOTE — Progress Notes (Signed)
Flu shot

## 2017-03-07 DIAGNOSIS — H35033 Hypertensive retinopathy, bilateral: Secondary | ICD-10-CM | POA: Diagnosis not present

## 2017-03-21 DIAGNOSIS — K080 Exfoliation of teeth due to systemic causes: Secondary | ICD-10-CM | POA: Diagnosis not present

## 2017-09-20 DIAGNOSIS — K080 Exfoliation of teeth due to systemic causes: Secondary | ICD-10-CM | POA: Diagnosis not present

## 2017-10-12 ENCOUNTER — Telehealth: Payer: Self-pay | Admitting: Family Medicine

## 2017-10-12 DIAGNOSIS — E785 Hyperlipidemia, unspecified: Secondary | ICD-10-CM

## 2017-10-12 DIAGNOSIS — I10 Essential (primary) hypertension: Secondary | ICD-10-CM

## 2017-10-12 MED ORDER — ATORVASTATIN CALCIUM 40 MG PO TABS: 40.0000 mg | ORAL_TABLET | Freq: Every day | ORAL | 1 refills | Status: DC

## 2017-10-12 MED ORDER — LISINOPRIL 20 MG PO TABS
20.0000 mg | ORAL_TABLET | Freq: Every day | ORAL | 1 refills | Status: DC
Start: 2017-10-12 — End: 2017-11-10

## 2017-10-12 NOTE — Telephone Encounter (Signed)
Copied from CRM [id number]. Topic: Quick Communication - Rx Refill/Question >> Oct 12, 2017 12:29 PM Jolayne Haines L wrote: Medication: lisinopril (PRINIVIL,ZESTRIL) 20 MG tablet  atorvastatin (LIPITOR) 40 MG tablet Has the patient contacted their pharmacy? Needs sent to new pharmacy (Agent: If no, request that the patient contact the pharmacy for the refill.) Preferred Pharmacy (with phone number or street name): [facility] [address] Agent: Please be advised that RX refills may take up to 3 business days. We ask that you follow-up with your pharmacy.

## 2017-10-12 NOTE — Telephone Encounter (Signed)
lisinopril refill Last OV: 09/16/16 Last Refill:09/16/16 #90 3 RF [facility] PCP: Dr Meredith Staggers  atorvastatin refill Last OV: 09/16/16 Last Refill:09/16/16 #90 tab 3 RF  Pt has upcoming visit 10/27/17 with PCP

## 2017-10-27 ENCOUNTER — Encounter: Payer: Federal, State, Local not specified - PPO | Admitting: Family Medicine

## 2017-10-28 ENCOUNTER — Other Ambulatory Visit: Payer: Self-pay | Admitting: Family Medicine

## 2017-10-28 DIAGNOSIS — K219 Gastro-esophageal reflux disease without esophagitis: Secondary | ICD-10-CM

## 2017-11-10 ENCOUNTER — Encounter: Payer: Self-pay | Admitting: Family Medicine

## 2017-11-10 ENCOUNTER — Other Ambulatory Visit: Payer: Self-pay

## 2017-11-10 ENCOUNTER — Ambulatory Visit (INDEPENDENT_AMBULATORY_CARE_PROVIDER_SITE_OTHER): Payer: Federal, State, Local not specified - PPO | Admitting: Family Medicine

## 2017-11-10 VITALS — BP 138/60 | HR 67 | Temp 98.1°F | Ht 69.0 in | Wt 183.2 lb

## 2017-11-10 DIAGNOSIS — E785 Hyperlipidemia, unspecified: Secondary | ICD-10-CM

## 2017-11-10 DIAGNOSIS — Z0000 Encounter for general adult medical examination without abnormal findings: Secondary | ICD-10-CM

## 2017-11-10 DIAGNOSIS — R42 Dizziness and giddiness: Secondary | ICD-10-CM | POA: Diagnosis not present

## 2017-11-10 DIAGNOSIS — Z1321 Encounter for screening for nutritional disorder: Secondary | ICD-10-CM

## 2017-11-10 DIAGNOSIS — I10 Essential (primary) hypertension: Secondary | ICD-10-CM

## 2017-11-10 DIAGNOSIS — Z79899 Other long term (current) drug therapy: Secondary | ICD-10-CM | POA: Diagnosis not present

## 2017-11-10 DIAGNOSIS — K219 Gastro-esophageal reflux disease without esophagitis: Secondary | ICD-10-CM | POA: Diagnosis not present

## 2017-11-10 DIAGNOSIS — Z23 Encounter for immunization: Secondary | ICD-10-CM

## 2017-11-10 DIAGNOSIS — Z125 Encounter for screening for malignant neoplasm of prostate: Secondary | ICD-10-CM | POA: Diagnosis not present

## 2017-11-10 DIAGNOSIS — N529 Male erectile dysfunction, unspecified: Secondary | ICD-10-CM | POA: Diagnosis not present

## 2017-11-10 MED ORDER — ZOSTER VAC RECOMB ADJUVANTED 50 MCG/0.5ML IM SUSR: 0.5000 mL | Freq: Once | INTRAMUSCULAR | 1 refills | Status: AC

## 2017-11-10 MED ORDER — LISINOPRIL 20 MG PO TABS: 20.0000 mg | ORAL_TABLET | Freq: Every day | ORAL | 2 refills | Status: DC

## 2017-11-10 MED ORDER — ATORVASTATIN CALCIUM 40 MG PO TABS: 40.0000 mg | ORAL_TABLET | Freq: Every day | ORAL | 2 refills | Status: DC

## 2017-11-10 MED ORDER — SILDENAFIL CITRATE 20 MG PO TABS: 20.0000 mg | ORAL_TABLET | Freq: Every day | ORAL | 2 refills | Status: DC | PRN

## 2017-11-10 MED ORDER — OMEPRAZOLE 20 MG PO CPDR: DELAYED_RELEASE_CAPSULE | ORAL | 3 refills | Status: DC

## 2017-11-10 NOTE — Patient Instructions (Addendum)
If diarrhea is not continuing to improve, please return to discuss further workup.   For lightheadedness, I will check some bloodwork, but may need to adjust meds further if those symptoms continue. See possible cause below and recommendations.  Make sure you stay well hydrated. Return to the clinic or go to the nearest emergency room if any of your symptoms worsen or new symptoms occur.  For difficulty with erections, we can check testosterone levels, but would need to be repeated between 8 and 10 am if low. Low dose sildenafil can be tried for now, but watch for low blood pressure symptoms (lightheadedness) with that medication as well as other side effects.   Shingles vaccine sent to your pharmacy, MMR vaccine given today.   No change in other meds for now. recheck in 6 months.   Keeping you healthy  Get these tests  Blood pressure- Have your blood pressure checked once a year by your healthcare provider.  Normal blood pressure is 120/80  Weight- Have your body mass index (BMI) calculated to screen for obesity.  BMI is a measure of body fat based on height and weight. You can also calculate your own BMI at [email or web address].  Cholesterol- Have your cholesterol checked every year.  Diabetes- Have your blood sugar checked regularly if you have high blood pressure, high cholesterol, have a family history of diabetes or if you are overweight.  Screening for Colon Cancer- Colonoscopy starting at age 76.  Screening may begin sooner depending on your family history and other health conditions. Follow up colonoscopy as directed by your Gastroenterologist.  Screening for Prostate Cancer- Both blood work (PSA) and a rectal exam help screen for Prostate Cancer.  Screening begins at age 79 with African-American men and at age 65 with Caucasian men.  Screening may begin sooner depending on your family history.  Take these medicines  Aspirin- One aspirin daily can help prevent Heart disease  and Stroke.  Flu shot- Every fall.  Tetanus- Every 10 years.  Zostavax- Once after the age of 42 to prevent Shingles.  Pneumonia shot- Once after the age of 32; if you are younger than 97, ask your healthcare provider if you need a Pneumonia shot.  Take these steps  Don't smoke- If you do smoke, talk to your doctor about quitting.  For tips on how to quit, go to www.smokefree.gov or call 1-800-QUIT-NOW.  Be physically active- Exercise 5 days a week for at least 30 minutes.  If you are not already physically active start slow and gradually work up to 30 minutes of moderate physical activity.  Examples of moderate activity include walking briskly, mowing the yard, dancing, swimming, bicycling, etc.  Eat a healthy diet- Eat a variety of healthy food such as fruits, vegetables, low fat milk, low fat cheese, yogurt, lean meant, poultry, fish, beans, tofu, etc. For more information go to www.thenutritionsource.org  Drink alcohol in moderation- Limit alcohol intake to less than two drinks a day. Never drink and drive.  Dentist- Brush and floss twice daily; visit your dentist twice a year.  Depression- Your emotional health is as important as your physical health. If you're feeling down, or losing interest in things you would normally enjoy please talk to your healthcare provider.  Eye exam- Visit your eye doctor every year.  Safe sex- If you may be exposed to a sexually transmitted infection, use a condom.  Seat belts- Seat belts can save your life; always wear one.  Smoke/[employer] services engineer- Psychologist, sport and exercise  need to be installed on the appropriate level of your home.  Replace batteries at least once a year.  Skin cancer- When out in the sun, cover up and use sunscreen 15 SPF or higher.  Violence- If anyone is threatening you, please tell your healthcare provider.  Living Will/ Health care power of attorney- Speak with your healthcare provider and family.   Orthostatic  Hypotension Orthostatic hypotension is a sudden drop in blood pressure that happens when you quickly change positions, such as when you get up from a seated or lying position. Blood pressure is a measurement of how strongly, or weakly, your blood is pressing against the walls of your arteries. Arteries are blood vessels that carry blood from your heart throughout your body. When blood pressure is too low, you may not get enough blood to your brain or to the rest of your organs. This can cause weakness, light-headedness, rapid heartbeat, and fainting. This can last for just a few seconds or for up to a few minutes. Orthostatic hypotension is usually not a serious problem. However, if it happens frequently or gets worse, it may be a sign of something more serious. What are the causes? This condition may be caused by:  Sudden changes in posture, such as standing up quickly after you have been sitting or lying down.  Blood loss.  Loss of body fluids (dehydration).  Heart problems.  Hormone (endocrine) problems.  Pregnancy.  Severe infection.  Lack of certain nutrients.  Severe allergic reactions (anaphylaxis).  Certain medicines, such as blood pressure medicine or medicines that make the body lose excess fluids (diuretics). Sometimes, this condition can be caused by not taking medicine as directed, such as taking too much of a certain medicine.  What increases the risk? Certain factors can make you more likely to develop orthostatic hypotension, including:  Age. Risk increases as you get older.  Conditions that affect the heart or the central nervous system.  Taking certain medicines, such as blood pressure medicine or diuretics.  Being pregnant.  What are the signs or symptoms? Symptoms of this condition may include:  Weakness.  Light-headedness.  Dizziness.  Blurred vision.  Fatigue.  Rapid heartbeat.  Fainting, in severe cases.  How is this diagnosed? This  condition is diagnosed based on:  Your medical history.  Your symptoms.  Your blood pressure measurement. Your health care provider will check your blood pressure when you are: ? Lying down. ? Sitting. ? Standing.  A blood pressure reading is recorded as two numbers, such as "120 over 80" (or 120/80). The first ("top") number is called the systolic pressure. It is a measure of the pressure in your arteries as your heart beats. The second ("bottom") number is called the diastolic pressure. It is a measure of the pressure in your arteries when your heart relaxes between beats. Blood pressure is measured in a unit called mm Hg. Healthy blood pressure for adults is 120/80. If your blood pressure is below 90/60, you may be diagnosed with hypotension. Other information or tests that may be used to diagnose orthostatic hypotension include:  Your other vital signs, such as your heart rate and temperature.  Blood tests.  Tilt table test. For this test, you will be safely secured to a table that moves you from a lying position to an upright position. Your heart rhythm and blood pressure will be monitored during the test.  How is this treated? Treatment for this condition may include:  Changing your diet. This  may involve eating more salt (sodium) or drinking more water.  Taking medicines to raise your blood pressure.  Changing the dosage of certain medicines you are taking that might be lowering your blood pressure.  Wearing compression stockings. These stockings help to prevent blood clots and reduce swelling in your legs.  In some cases, you may need to go to the hospital for:  Fluid replacement. This means you will receive fluids through an IV tube.  Blood replacement. This means you will receive donated blood through an IV tube (transfusion).  Treating an infection or heart problems, if this applies.  Monitoring. You may need to be monitored while medicines that you are taking wear  off.  Follow these instructions at home: Eating and drinking   Drink enough fluid to keep your urine clear or pale yellow.  Eat a healthy diet and follow instructions from your health care provider about eating or drinking restrictions. A healthy diet includes: ? Fresh fruits and vegetables. ? Whole grains. ? Lean meats. ? Low-fat dairy products.  Eat extra salt only as directed. Do not add extra salt to your diet unless your health care provider told you to do that.  Eat frequent, small meals.  Avoid standing up suddenly after eating. Medicines  Take over-the-counter and prescription medicines only as told by your health care provider. ? Follow instructions from your health care provider about changing the dosage of your current medicines, if this applies. ? Do not stop or adjust any of your medicines on your own. General instructions  Wear compression stockings as told by your health care provider.  Get up slowly from lying down or sitting positions. This gives your blood pressure a chance to adjust.  Avoid hot showers and excessive heat as directed by your health care provider.  Return to your normal activities as told by your health care provider. Ask your health care provider what activities are safe for you.  Do not use any products that contain nicotine or tobacco, such as cigarettes and e-cigarettes. If you need help quitting, ask your health care provider.  Keep all follow-up visits as told by your health care provider. This is important. Contact a health care provider if:  You vomit.  You have diarrhea.  You have a fever for more than 2-3 days.  You feel more thirsty than usual.  You feel weak and tired. Get help right away if:  You have chest pain.  You have a fast or irregular heartbeat.  You develop numbness in any part of your body.  You cannot move your arms or your legs.  You have trouble speaking.  You become sweaty or feel  lightheaded.  You faint.  You feel short of breath.  You have trouble staying awake.  You feel confused. This information is not intended to replace advice given to you by your health care provider. Make sure you discuss any questions you have with your health care provider. Document Released: 05/07/2002 Document Revised: 02/03/2016 Document Reviewed: 11/07/2015 Elsevier Interactive Patient Education  2018 [employer].    IF you received an x-ray today, you will receive an invoice from [facility]. Please contact [facility] at [phone] with questions or concerns regarding your invoice.   IF you received labwork today, you will receive an invoice from [facility]. Please contact [facility] at [phone] with questions or concerns regarding your invoice.   Our billing staff will not be able to assist you with questions regarding bills from these companies.  You will be contacted with the lab results as soon as they are available. The fastest way to get your results is to activate your My Chart account. Instructions are located on the last page of this paperwork. If you have not heard from us regarding the results in 2 weeks, please contact this office.      

## 2017-11-10 NOTE — Progress Notes (Signed)
Subjective:  By signing my name below, I, Margit Banda, attest that this documentation has been prepared under the direction and in the presence of Wendie Agreste, MD. Electronically Signed: Margit Banda, Medical Scribe 11/10/17 at 4:52 PM.   Patient ID: Drew Miles, male    DOB: [birth year], 57 y.o.   MRN: [id number]  Chief Complaint  Patient presents with  . Annual Exam    CPE   HPI Drew Miles is a 57 y.o. male who presents to Primary Care at [facility] for his an annual physical.   On Saturday night, 11/05/2017 he woke up from his sleep due to an episode of diarrhea, that is improving. Associated symptoms include syncope and diaphoresis.   He has never passed out from having diarrhea before, but does add that he has from vomiting. He also has experienced light headedness when standing too fast or after bending over while standing. He does stay hydrated and monitors his blood pressure at home. He tries not to add any excess salt to his food, but does endorse drinking soda.   He reports intermittent issues with maintaining an erection and is interested in trying Viagra or the generic version. There is no change in his relationships and he denies stress. However, he does report that his wife has been stressed out more due to family issues.   He denies chest pain, SOB, headaches, fever, chills or any other symptoms or complaints at this time.    HTN, Lisinopril, 20 mg QD This was decreased to 20 mg at last physical. He monitors his blood pressure at home and reports the top number varies between 112 -130 while the bottom number has been consistently normal.   Lab Results  Component Value Date   CREATININE 1.00 09/16/2016   BP Readings from Last 3 Encounters:  11/10/17 138/60  09/16/16 99/64  02/11/16 129/89    HYPERLIPIDEMIA, Lipitor 40 mg QD Lab Results  Component Value Date   LIPASE 23 01/17/2013   Lab Results  Component Value Date   ALT 19 09/16/2016   AST 20  09/16/2016   ALKPHOS 64 09/16/2016   BILITOT 0.4 09/16/2016    GERD, Omeprazole 20 mg QD Previously was on 40 mg and was decreased to 20 mg at last physical. Normal B12 and Magnesium was checked last few years. He reports taking B12 and a multivitamin at home.   PRE-DIABETES, Lost weight - resolved Lab Results  Component Value Date   HGBA1C 5.4 09/16/2016   Wt Readings from Last 3 Encounters:  11/10/17 183 lb 3.2 oz (83.1 kg)  09/16/16 165 lb (74.8 kg)  02/11/16 188 lb 9.6 oz (85.5 kg)   EXERCISE He does walk a couple times a week, but adds he does not exercise as often as he should.   CANCER SCREENING Colonoscopy, 2017 at [facility] GI Prostate screening, normal PSA of 0.8 on 09/16/2016, he would like to have his PSA levels tested again today.   IMMUNIZATIONS  Shingles vaccination was last done about 6 years ago. He will get another one done in the near future. Immunization History  Administered Date(s) Administered  . Influenza Split 02/25/2012  . Influenza,inj,Quad PF,6+ Mos 02/06/2013, 02/25/2014, 02/16/2015, 02/21/2017  . Tdap 11/11/2009     DEPRESSION SCREENING Depression screen [facility] 2/9 11/10/2017 09/16/2016 09/18/2015 10/31/2014  Decreased Interest 0 0 0 0  Down, Depressed, Hopeless 0 0 0 0  PHQ - 2 Score 0 0 0 0   VISION SCREENING Last saw  eye doctor in the fall 2018. Goes once a year.  Visual Acuity Screening   Right eye Left eye Both eyes  Without correction:     With correction: '20/25 20/20 20/15 '$   DENTIST He goes to the dentist regularly.     Patient Active Problem List   Diagnosis Date Noted  . Persistent headaches 10/31/2014  . Obesity, unspecified 02/06/2013  . Hypertension 02/01/2012  . Hyperlipidemia 02/01/2012  . Reflux 02/01/2012  . Abnormal CT scan 02/01/2012   Past Medical History:  Diagnosis Date  . Allergy   . Hyperlipidemia   . Hypertension    Past Surgical History:  Procedure Laterality Date  . SHOULDER SURGERY  2013  . VASECTOMY      Allergies  Allergen Reactions  . Anesthetics, Ester Other (See Comments)    INCREASED BP   Prior to Admission medications   Medication Sig Start Date End Date Taking? Authorizing Provider  aspirin EC 81 MG tablet Take 81 mg by mouth daily.    [provider]  atorvastatin (LIPITOR) 40 MG tablet Take 1 tablet (40 mg total) by mouth daily. 10/12/17   Wendie Agreste, MD  Cholecalciferol (VITAMIN D) 400 UNITS capsule Take 400 Units by mouth daily.    [provider]  fish oil-omega-3 fatty acids 1000 MG capsule Take 2 g by mouth daily.    [provider]  lisinopril (PRINIVIL,ZESTRIL) 20 MG tablet Take 1 tablet (20 mg total) by mouth daily. t 10/12/17   Wendie Agreste, MD  Multiple Vitamin (MULTIVITAMIN) tablet Take 1 tablet by mouth daily.    [provider]  omeprazole (PRILOSEC) 20 MG capsule TAKE 1 TABLET BY MOUTH DAILY 10/28/17   Wendie Agreste, MD   Social History   Socioeconomic History  . Marital status: Married    Spouse name: Marliss Coots  . Number of children: 3  . Years of education: Bachelor's  . Highest education level: Not on file  Occupational History  . Occupation: IT trainer: [employer]  Social Needs  . Financial resource strain: Not on file  . Food insecurity:    Worry: Not on file    Inability: Not on file  . Transportation needs:    Medical: Not on file    Non-medical: Not on file  Tobacco Use  . Smoking status: Former Smoker    Years: 5.00    Types: Cigarettes    Last attempt to quit: 05/31/1980    Years since quitting: 37.4  . Smokeless tobacco: Never Used  Substance and Sexual Activity  . Alcohol use: No  . Drug use: No  . Sexual activity: Yes  Lifestyle  . Physical activity:    Days per week: Not on file    Minutes per session: Not on file  . Stress: Not on file  Relationships  . Social connections:    Talks on phone: Not on file    Gets together: Not on file    Attends religious service: Not on  file    Active member of club or organization: Not on file    Attends meetings of clubs or organizations: Not on file    Relationship status: Not on file  . Intimate partner violence:    Fear of current or ex partner: Not on file    Emotionally abused: Not on file    Physically abused: Not on file    Forced sexual activity: Not on file  Other Topics Concern  .  Not on file  Social History Narrative   Lives with wife   Caffeine use: 3-4 diet cokes per day   Married. Education: [employer]. Exercise: No     Review of Systems  Constitutional: Positive for diaphoresis. Negative for chills and fever.  Respiratory: Negative for shortness of breath.   Cardiovascular: Negative for chest pain.  Gastrointestinal: Positive for diarrhea.  Neurological: Positive for syncope. Negative for headaches.   13 point ROS reviewed and negative.      Objective:   Physical Exam  Constitutional: He is oriented to person, place, and time. He appears well-developed and well-nourished.  HENT:  Head: Normocephalic and atraumatic.  Right Ear: External ear normal.  Left Ear: External ear normal.  Mouth/Throat: Oropharynx is clear and moist.  Eyes: Pupils are equal, round, and reactive to light. Conjunctivae and EOM are normal.  Neck: Normal range of motion. Neck supple. No thyromegaly present.  Cardiovascular: Normal rate, regular rhythm, normal heart sounds and intact distal pulses.  Pulmonary/Chest: Effort normal and breath sounds normal. No respiratory distress. He has no wheezes.  Abdominal: Soft. He exhibits no distension. There is no tenderness. Hernia confirmed negative in the right inguinal area and confirmed negative in the left inguinal area.  Genitourinary: Prostate normal.  Musculoskeletal: Normal range of motion. He exhibits no edema or tenderness.  Lymphadenopathy:    He has no cervical adenopathy.  Neurological: He is alert and oriented to person, place, and time. He has normal reflexes.    Skin: Skin is warm and dry.  Psychiatric: He has a normal mood and affect. His behavior is normal.  Vitals reviewed.    Vitals:   11/10/17 1638  BP: 138/60  Pulse: 67  Temp: 98.1 F (36.7 C)  TempSrc: Oral  SpO2: 99%  Weight: 183 lb 3.2 oz (83.1 kg)  Height: '5\' 9"'$  (1.753 m)        Assessment & Plan:    HOWELL GROESBECK is a 57 y.o. male Annual physical exam  - -anticipatory guidance as below in AVS, screening labs above. Health maintenance items as above in HPI discussed/recommended as applicable.   Episodic lightheadedness - Plan: CBC  -Maintain hydration, RTC precautions if persistent as may need medication adjustment.  Check CBC.  Erectile dysfunction, unspecified erectile dysfunction type - Plan: Testosterone, Free, Total, SHBG, sildenafil (REVATIO) 20 MG tablet  -sildenafil Rx given - use lowest effective dose. Side effects discussed (including but not limited to headache/flushing, blue discoloration of vision, possible vascular steal and risk of cardiac effects if underlying unknown coronary artery disease, and permanent sensorineural hearing loss). Understanding expressed.   -Check testosterone, but discussed if that is low will need to be repeated between 8 and 10 in the morning for more accurate testing    Need for MMR vaccine - Plan: MMR vaccine subcutaneous requested and given.   - Encounter for screening for nutritional disorder - Plan: Vitamin B12, VITAMIN D 25 Hydroxy (Vit-D Deficiency, Fractures), Magnesium Use of proton pump inhibitor therapy - Plan: Vitamin B12, VITAMIN D 25 Hydroxy (Vit-D Deficiency, Fractures), Magnesium Gastroesophageal reflux disease, esophagitis presence not specified - Plan: omeprazole (PRILOSEC) 20 MG capsule  -Overall stable symptom control with omeprazole, but will check for deficiencies with daily use as above.  Essential hypertension - Plan: Comprehensive metabolic panel, lisinopril (PRINIVIL,ZESTRIL) 20 MG tablet  -Stable, continue  same dose of lisinopril for now.  If lightheadedness persists, return to consider medication adjustment.  Hyperlipidemia, unspecified hyperlipidemia type - Plan: Comprehensive metabolic panel,  Lipid panel, atorvastatin (LIPITOR) 40 MG tablet  -Tolerating Lipitor, continue same dose, labs pending  Screening for prostate cancer - Plan: PSA  -We discussed pros and cons of prostate cancer screening, and after this discussion, he chose to have screening done. PSA obtained, and no concerning findings on DRE.   Need for shingles vaccine - Plan: Zoster Vaccine Adjuvanted [facility]) injection given.    Meds ordered this encounter  Medications  . sildenafil (REVATIO) 20 MG tablet    Sig: Take 1-2 tablets (20-40 mg total) by mouth daily as needed. Prior to sexual activity    Dispense:  10 tablet    Refill:  2  . omeprazole (PRILOSEC) 20 MG capsule    Sig: TAKE 1 TABLET BY MOUTH DAILY    Dispense:  90 capsule    Refill:  3  . lisinopril (PRINIVIL,ZESTRIL) 20 MG tablet    Sig: Take 1 tablet (20 mg total) by mouth daily. t    Dispense:  90 tablet    Refill:  2  . atorvastatin (LIPITOR) 40 MG tablet    Sig: Take 1 tablet (40 mg total) by mouth daily at 6 PM.    Dispense:  90 tablet    Refill:  2  . Zoster Vaccine Adjuvanted [facility]) injection    Sig: Inject 0.5 mLs into the muscle once for 1 dose. Repeat in 2-6 months.    Dispense:  0.5 mL    Refill:  1   Patient Instructions   If diarrhea is not continuing to improve, please return to discuss further workup.   For lightheadedness, I will check some bloodwork, but may need to adjust meds further if those symptoms continue. See possible cause below and recommendations.  Make sure you stay well hydrated. Return to the clinic or go to the nearest emergency room if any of your symptoms worsen or new symptoms occur.  For difficulty with erections, we can check testosterone levels, but would need to be repeated between 8 and 10 am if low. Low  dose sildenafil can be tried for now, but watch for low blood pressure symptoms (lightheadedness) with that medication as well as other side effects.   Shingles vaccine sent to your pharmacy, MMR vaccine given today.   No change in other meds for now. recheck in 6 months.   Keeping you healthy  Get these tests  Blood pressure- Have your blood pressure checked once a year by your healthcare provider.  Normal blood pressure is 120/80  Weight- Have your body mass index (BMI) calculated to screen for obesity.  BMI is a measure of body fat based on height and weight. You can also calculate your own BMI at ViewBanking.si.  Cholesterol- Have your cholesterol checked every year.  Diabetes- Have your blood sugar checked regularly if you have high blood pressure, high cholesterol, have a family history of diabetes or if you are overweight.  Screening for Colon Cancer- Colonoscopy starting at age 59.  Screening may begin sooner depending on your family history and other health conditions. Follow up colonoscopy as directed by your Gastroenterologist.  Screening for Prostate Cancer- Both blood work (PSA) and a rectal exam help screen for Prostate Cancer.  Screening begins at age 25 with African-American men and at age 74 with Caucasian men.  Screening may begin sooner depending on your family history.  Take these medicines  Aspirin- One aspirin daily can help prevent Heart disease and Stroke.  Flu shot- Every fall.  Tetanus- Every 10 years.  Zostavax- Once after the age of 49 to prevent Shingles.  Pneumonia shot- Once after the age of 55; if you are younger than 87, ask your healthcare provider if you need a Pneumonia shot.  Take these steps  Don't smoke- If you do smoke, talk to your doctor about quitting.  For tips on how to quit, go to www.smokefree.gov or call 1-800-QUIT-NOW.  Be physically active- Exercise 5 days a week for at least 30 minutes.  If you are not already  physically active start slow and gradually work up to 30 minutes of moderate physical activity.  Examples of moderate activity include walking briskly, mowing the yard, dancing, swimming, bicycling, etc.  Eat a healthy diet- Eat a variety of healthy food such as fruits, vegetables, low fat milk, low fat cheese, yogurt, lean meant, poultry, fish, beans, tofu, etc. For more information go to www.thenutritionsource.org  Drink alcohol in moderation- Limit alcohol intake to less than two drinks a day. Never drink and drive.  Dentist- Brush and floss twice daily; visit your dentist twice a year.  Depression- Your emotional health is as important as your physical health. If you're feeling down, or losing interest in things you would normally enjoy please talk to your healthcare provider.  Eye exam- Visit your eye doctor every year.  Safe sex- If you may be exposed to a sexually transmitted infection, use a condom.  Seat belts- Seat belts can save your life; always wear one.  Smoke/Carbon Monoxide detectors- These detectors need to be installed on the appropriate level of your home.  Replace batteries at least once a year.  Skin cancer- When out in the sun, cover up and use sunscreen 15 SPF or higher.  Violence- If anyone is threatening you, please tell your healthcare provider.  Living Will/ Health care power of attorney- Speak with your healthcare provider and family.   Orthostatic Hypotension Orthostatic hypotension is a sudden drop in blood pressure that happens when you quickly change positions, such as when you get up from a seated or lying position. Blood pressure is a measurement of how strongly, or weakly, your blood is pressing against the walls of your arteries. Arteries are blood vessels that carry blood from your heart throughout your body. When blood pressure is too low, you may not get enough blood to your brain or to the rest of your organs. This can cause weakness,  light-headedness, rapid heartbeat, and fainting. This can last for just a few seconds or for up to a few minutes. Orthostatic hypotension is usually not a serious problem. However, if it happens frequently or gets worse, it may be a sign of something more serious. What are the causes? This condition may be caused by:  Sudden changes in posture, such as standing up quickly after you have been sitting or lying down.  Blood loss.  Loss of body fluids (dehydration).  Heart problems.  Hormone (endocrine) problems.  Pregnancy.  Severe infection.  Lack of certain nutrients.  Severe allergic reactions (anaphylaxis).  Certain medicines, such as blood pressure medicine or medicines that make the body lose excess fluids (diuretics). Sometimes, this condition can be caused by not taking medicine as directed, such as taking too much of a certain medicine.  What increases the risk? Certain factors can make you more likely to develop orthostatic hypotension, including:  Age. Risk increases as you get older.  Conditions that affect the heart or the central nervous system.  Taking certain medicines, such as blood pressure medicine  or diuretics.  Being pregnant.  What are the signs or symptoms? Symptoms of this condition may include:  Weakness.  Light-headedness.  Dizziness.  Blurred vision.  Fatigue.  Rapid heartbeat.  Fainting, in severe cases.  How is this diagnosed? This condition is diagnosed based on:  Your medical history.  Your symptoms.  Your blood pressure measurement. Your health care provider will check your blood pressure when you are: ? Lying down. ? Sitting. ? Standing.  A blood pressure reading is recorded as two numbers, such as "120 over 80" (or 120/80). The first ("top") number is called the systolic pressure. It is a measure of the pressure in your arteries as your heart beats. The second ("bottom") number is called the diastolic pressure. It is a  measure of the pressure in your arteries when your heart relaxes between beats. Blood pressure is measured in a unit called mm Hg. Healthy blood pressure for adults is 120/80. If your blood pressure is below 90/60, you may be diagnosed with hypotension. Other information or tests that may be used to diagnose orthostatic hypotension include:  Your other vital signs, such as your heart rate and temperature.  Blood tests.  Tilt table test. For this test, you will be safely secured to a table that moves you from a lying position to an upright position. Your heart rhythm and blood pressure will be monitored during the test.  How is this treated? Treatment for this condition may include:  Changing your diet. This may involve eating more salt (sodium) or drinking more water.  Taking medicines to raise your blood pressure.  Changing the dosage of certain medicines you are taking that might be lowering your blood pressure.  Wearing compression stockings. These stockings help to prevent blood clots and reduce swelling in your legs.  In some cases, you may need to go to the hospital for:  Fluid replacement. This means you will receive fluids through an IV tube.  Blood replacement. This means you will receive donated blood through an IV tube (transfusion).  Treating an infection or heart problems, if this applies.  Monitoring. You may need to be monitored while medicines that you are taking wear off.  Follow these instructions at home: Eating and drinking   Drink enough fluid to keep your urine clear or pale yellow.  Eat a healthy diet and follow instructions from your health care provider about eating or drinking restrictions. A healthy diet includes: ? Fresh fruits and vegetables. ? Whole grains. ? Lean meats. ? Low-fat dairy products.  Eat extra salt only as directed. Do not add extra salt to your diet unless your health care provider told you to do that.  Eat frequent, small  meals.  Avoid standing up suddenly after eating. Medicines  Take over-the-counter and prescription medicines only as told by your health care provider. ? Follow instructions from your health care provider about changing the dosage of your current medicines, if this applies. ? Do not stop or adjust any of your medicines on your own. General instructions  Wear compression stockings as told by your health care provider.  Get up slowly from lying down or sitting positions. This gives your blood pressure a chance to adjust.  Avoid hot showers and excessive heat as directed by your health care provider.  Return to your normal activities as told by your health care provider. Ask your health care provider what activities are safe for you.  Do not use any products that contain nicotine or  tobacco, such as cigarettes and e-cigarettes. If you need help quitting, ask your health care provider.  Keep all follow-up visits as told by your health care provider. This is important. Contact a health care provider if:  You vomit.  You have diarrhea.  You have a fever for more than 2-3 days.  You feel more thirsty than usual.  You feel weak and tired. Get help right away if:  You have chest pain.  You have a fast or irregular heartbeat.  You develop numbness in any part of your body.  You cannot move your arms or your legs.  You have trouble speaking.  You become sweaty or feel lightheaded.  You faint.  You feel short of breath.  You have trouble staying awake.  You feel confused. This information is not intended to replace advice given to you by your health care provider. Make sure you discuss any questions you have with your health care provider. Document Released: 05/07/2002 Document Revised: 02/03/2016 Document Reviewed: 11/07/2015 Elsevier Interactive Patient Education  2018 [employer].    IF you received an x-ray today, you will receive an invoice from [facility]  Radiology. Please contact [facility] Radiology at [phone] with questions or concerns regarding your invoice.   IF you received labwork today, you will receive an invoice from [place]. Please contact [facility] at [phone] with questions or concerns regarding your invoice.   Our billing staff will not be able to assist you with questions regarding bills from these companies.  You will be contacted with the lab results as soon as they are available. The fastest way to get your results is to activate your My Chart account. Instructions are located on the last page of this paperwork. If you have not heard from [place] regarding the results in 2 weeks, please contact this office.       I personally performed the services described in this documentation, which was scribed in my presence. The recorded information has been reviewed and considered for accuracy and completeness, addended by me as needed, and agree with information above.  Signed,   Merri Ray, MD [facility] [place].  11/16/17 12:19 PM

## 2017-11-11 LAB — TESTOSTERONE, FREE, TOTAL, SHBG
Sex Hormone Binding: 81.1 nmol/L — ABNORMAL HIGH (ref 19.3–76.4)
Testosterone, Free: 6 pg/mL — ABNORMAL LOW (ref 7.2–24.0)
Testosterone: 513 ng/dL (ref 264–916)

## 2017-11-11 LAB — COMPREHENSIVE METABOLIC PANEL
ALT: 25 IU/L (ref 0–44)
AST: 35 IU/L (ref 0–40)
Albumin/Globulin Ratio: 2.1 (ref 1.2–2.2)
Albumin: 4.6 g/dL (ref 3.5–5.5)
Alkaline Phosphatase: 67 IU/L (ref 39–117)
BUN/Creatinine Ratio: 10 (ref 9–20)
BUN: 9 mg/dL (ref 6–24)
Bilirubin Total: 0.5 mg/dL (ref 0.0–1.2)
CO2: 26 mmol/L (ref 20–29)
Calcium: 9.1 mg/dL (ref 8.7–10.2)
Chloride: 101 mmol/L (ref 96–106)
Creatinine, Ser: 0.94 mg/dL (ref 0.76–1.27)
GFR calc Af Amer: 104 mL/min/{1.73_m2} (ref >59)
GFR calc non Af Amer: 90 mL/min/{1.73_m2} (ref >59)
Globulin, Total: 2.2 g/dL (ref 1.5–4.5)
Glucose: 85 mg/dL (ref 65–99)
Potassium: 4.2 mmol/L (ref 3.5–5.2)
Sodium: 142 mmol/L (ref 134–144)
Total Protein: 6.8 g/dL (ref 6.0–8.5)

## 2017-11-11 LAB — VITAMIN D 25 HYDROXY (VIT D DEFICIENCY, FRACTURES): Vit D, 25-Hydroxy: 46.8 ng/mL (ref 30.0–100.0)

## 2017-11-11 LAB — LIPID PANEL
Chol/HDL Ratio: 3.4 ratio (ref 0.0–5.0)
Cholesterol, Total: 112 mg/dL (ref 100–199)
HDL: 33 mg/dL — ABNORMAL LOW (ref >39)
LDL Calculated: 59 mg/dL (ref 0–99)
Triglycerides: 98 mg/dL (ref 0–149)
VLDL Cholesterol Cal: 20 mg/dL (ref 5–40)

## 2017-11-11 LAB — CBC
Hematocrit: 43.1 % (ref 37.5–51.0)
Hemoglobin: 14.3 g/dL (ref 13.0–17.7)
MCH: 26.6 pg (ref 26.6–33.0)
MCHC: 33.2 g/dL (ref 31.5–35.7)
MCV: 80 fL (ref 79–97)
Platelets: 190 10*3/uL (ref 150–450)
RBC: 5.38 x10E6/uL (ref 4.14–5.80)
RDW: 13.5 % (ref 12.3–15.4)
WBC: 7 10*3/uL (ref 3.4–10.8)

## 2017-11-11 LAB — VITAMIN B12: Vitamin B-12: 889 pg/mL (ref 232–1245)

## 2017-11-11 LAB — PSA: Prostate Specific Ag, Serum: 0.9 ng/mL (ref 0.0–4.0)

## 2017-11-11 LAB — MAGNESIUM: Magnesium: 2.1 mg/dL (ref 1.6–2.3)

## 2017-11-29 ENCOUNTER — Encounter: Payer: Self-pay | Admitting: Family Medicine

## 2017-12-05 ENCOUNTER — Encounter: Payer: Self-pay | Admitting: Family Medicine

## 2018-01-22 DIAGNOSIS — M545 Low back pain: Secondary | ICD-10-CM | POA: Diagnosis not present

## 2018-02-27 DIAGNOSIS — M7500 Adhesive capsulitis of unspecified shoulder: Secondary | ICD-10-CM | POA: Insufficient documentation

## 2018-02-27 DIAGNOSIS — M7711 Lateral epicondylitis, right elbow: Secondary | ICD-10-CM | POA: Diagnosis not present

## 2018-02-27 DIAGNOSIS — M7501 Adhesive capsulitis of right shoulder: Secondary | ICD-10-CM | POA: Diagnosis not present

## 2018-02-27 DIAGNOSIS — M25519 Pain in unspecified shoulder: Secondary | ICD-10-CM | POA: Insufficient documentation

## 2018-02-27 DIAGNOSIS — M7541 Impingement syndrome of right shoulder: Secondary | ICD-10-CM | POA: Diagnosis not present

## 2018-03-25 ENCOUNTER — Other Ambulatory Visit: Payer: Self-pay | Admitting: Family Medicine

## 2018-03-25 DIAGNOSIS — N529 Male erectile dysfunction, unspecified: Secondary | ICD-10-CM

## 2018-03-27 DIAGNOSIS — K080 Exfoliation of teeth due to systemic causes: Secondary | ICD-10-CM | POA: Diagnosis not present

## 2018-03-27 NOTE — Telephone Encounter (Signed)
Requested Prescriptions  Pending Prescriptions Disp Refills  . sildenafil (REVATIO) 20 MG tablet [Pharmacy Med Name: SILDENAFIL 20MG  TAB] 10 tablet 0    Sig: TAKE 1-2 TABS (20-40MG ) A DAY AS NEEDED PRIOR TO SEXUAL ACTIVITY     Urology: Erectile Dysfunction Agents Passed - 03/25/2018  5:30 AM      Passed - Last BP in normal range    BP Readings from Last 1 Encounters:  11/10/17 138/60         Passed - Valid encounter within last 12 months    Recent Outpatient Visits          4 months ago Annual physical exam   [facility], Asencion Partridge, MD   1 year ago Annual physical exam   [facility], Asencion Partridge, MD   2 years ago Annual physical exam   [facility], [facility]   3 years ago Routine general medical examination at a health care facility   [facility]   4 years ago Other and unspecified hyperlipidemia   [facility], Maylon Peppers, MD

## 2018-04-10 DIAGNOSIS — H35033 Hypertensive retinopathy, bilateral: Secondary | ICD-10-CM | POA: Diagnosis not present

## 2018-05-08 ENCOUNTER — Encounter: Payer: Self-pay | Admitting: Family Medicine

## 2018-05-09 ENCOUNTER — Encounter: Payer: Self-pay | Admitting: Family Medicine

## 2018-05-11 ENCOUNTER — Encounter: Payer: Self-pay | Admitting: Family Medicine

## 2018-05-11 ENCOUNTER — Other Ambulatory Visit: Payer: Self-pay | Admitting: Family Medicine

## 2018-05-11 MED ORDER — ROSUVASTATIN CALCIUM 10 MG PO TABS: 10.0000 mg | ORAL_TABLET | Freq: Every day | ORAL | 0 refills | Status: DC

## 2018-07-24 ENCOUNTER — Other Ambulatory Visit: Payer: Self-pay | Admitting: Family Medicine

## 2018-10-01 ENCOUNTER — Other Ambulatory Visit: Payer: Self-pay | Admitting: Family Medicine

## 2018-10-01 DIAGNOSIS — I10 Essential (primary) hypertension: Secondary | ICD-10-CM

## 2018-10-01 NOTE — Telephone Encounter (Signed)
Please advise on refill.

## 2018-10-24 ENCOUNTER — Other Ambulatory Visit: Payer: Self-pay | Admitting: Family Medicine

## 2018-10-24 DIAGNOSIS — I10 Essential (primary) hypertension: Secondary | ICD-10-CM

## 2018-10-24 NOTE — Telephone Encounter (Signed)
Requested medication (s) are due for refill today:   Yes for Lisinopril and Crestor   Requested medication (s) are on the active medication list:   Yes  Future visit scheduled:   No.   Needs OV.   Already had 30 day courtesy refill.   Last ordered: Both medications filled 10/02/2018  #30  0 refills. Sent to [facility] for further disposition   Requested Prescriptions  Pending Prescriptions Disp Refills   lisinopril (ZESTRIL) 10 MG tablet [Pharmacy Med Name: LISINOPRIL 10 MG TABLET] 60 tablet 0    Sig: TAKE 2 TABS (20 MG TOTAL) BY MOUTH DAILY. ** 20 MG ON BACKORDER**     Cardiovascular:  ACE Inhibitors Failed - 10/24/2018 10:37 AM      Failed - Cr in normal range and within 180 days    Creat  Date Value Ref Range Status  09/18/2015 1.03 0.70 - 1.33 mg/dL Final   Creatinine, Ser  Date Value Ref Range Status  11/10/2017 0.94 0.76 - 1.27 mg/dL Final         Failed - K in normal range and within 180 days    Potassium  Date Value Ref Range Status  11/10/2017 4.2 3.5 - 5.2 mmol/L Final         Failed - Valid encounter within last 6 months    Recent Outpatient Visits          11 months ago Annual physical exam   [facility], Asencion PartridgeJeffrey R, MD   2 years ago Annual physical exam   [facility], Asencion PartridgeJeffrey R, MD   3 years ago Annual physical exam   [facility], Steven A, MD   3 years ago Routine general medical examination at a health care facility   [facility], Steven A, MD   4 years ago Other and unspecified hyperlipidemia   [facility], Steven A, MD             Passed - Patient is not pregnant      Passed - Last BP in normal range    BP Readings from Last 1 Encounters:  11/10/17 138/60        rosuvastatin (CRESTOR) 10 MG tablet [Pharmacy Med Name: ROSUVASTATIN CALCIUM 10 MG TAB] 30 tablet 0    Sig: TAKE 1 TABLET BY MOUTH EVERY DAY     Cardiovascular:  Antilipid - Statins Failed -  10/24/2018 10:37 AM      Failed - HDL in normal range and within 360 days    HDL  Date Value Ref Range Status  11/10/2017 33 (L) >39 mg/dL Final         Passed - Total Cholesterol in normal range and within 360 days    Cholesterol, Total  Date Value Ref Range Status  11/10/2017 112 100 - 199 mg/dL Final         Passed - LDL in normal range and within 360 days    LDL Calculated  Date Value Ref Range Status  11/10/2017 59 0 - 99 mg/dL Final         Passed - Triglycerides in normal range and within 360 days    Triglycerides  Date Value Ref Range Status  11/10/2017 98 0 - 149 mg/dL Final         Passed - Patient is not pregnant      Passed - Valid encounter within last 12  months    Recent Outpatient Visits          11 months ago Annual physical exam   [facility], Asencion Partridge, MD   2 years ago Annual physical exam   [facility], Asencion Partridge, MD   3 years ago Annual physical exam   [facility], Maylon Peppers, MD   3 years ago Routine general medical examination at a health care facility   [facility], [facility]   4 years ago Other and unspecified hyperlipidemia   [facility], Maylon Peppers, MD

## 2018-10-26 ENCOUNTER — Encounter: Payer: Self-pay | Admitting: *Deleted

## 2018-10-26 NOTE — Telephone Encounter (Signed)
Message sent patient needs an appointment

## 2018-10-30 ENCOUNTER — Telehealth (INDEPENDENT_AMBULATORY_CARE_PROVIDER_SITE_OTHER): Payer: Federal, State, Local not specified - PPO | Admitting: Family Medicine

## 2018-10-30 DIAGNOSIS — Z130 Encounter for screening for diseases of the blood and blood-forming organs and certain disorders involving the immune mechanism: Secondary | ICD-10-CM

## 2018-10-30 DIAGNOSIS — N529 Male erectile dysfunction, unspecified: Secondary | ICD-10-CM

## 2018-10-30 DIAGNOSIS — I10 Essential (primary) hypertension: Secondary | ICD-10-CM

## 2018-10-30 DIAGNOSIS — E785 Hyperlipidemia, unspecified: Secondary | ICD-10-CM

## 2018-10-30 DIAGNOSIS — K219 Gastro-esophageal reflux disease without esophagitis: Secondary | ICD-10-CM

## 2018-10-30 DIAGNOSIS — Z79899 Other long term (current) drug therapy: Secondary | ICD-10-CM

## 2018-10-30 DIAGNOSIS — M79674 Pain in right toe(s): Secondary | ICD-10-CM

## 2018-10-30 MED ORDER — LISINOPRIL 20 MG PO TABS: 20.0000 mg | ORAL_TABLET | Freq: Every day | ORAL | 3 refills | Status: DC

## 2018-10-30 MED ORDER — ROSUVASTATIN CALCIUM 10 MG PO TABS: 10.0000 mg | ORAL_TABLET | Freq: Every day | ORAL | 3 refills | Status: DC

## 2018-10-30 MED ORDER — OMEPRAZOLE 20 MG PO CPDR: DELAYED_RELEASE_CAPSULE | ORAL | 3 refills | Status: DC

## 2018-10-30 MED ORDER — SILDENAFIL CITRATE 20 MG PO TABS: ORAL_TABLET | ORAL | 11 refills | Status: DC

## 2018-10-30 NOTE — Patient Instructions (Addendum)
No change in medications at this time.  Follow-up for fasting blood work and if there are any concerns I will let you know.  You can try to skip occasional doses of proton pump inhibitor to see if it is just as effective, but if that is needed daily, okay to remain on daily dosing.  I will check for some nutritional deficiencies that are associated with daily use of that medication.  Let me know if you would like to follow-up for that toe injury, and I can place an order for an x-ray to be performed prior to that office visit.  Take care.  Follow-up in approximately 6 months.  Let me know if there are questions in the meantime.     If you have lab work done today you will be contacted with your lab results within the next 2 weeks.  If you have not heard from [place] then please contact us. The fastest way to get your results is to register for My Chart.   IF you received an x-ray today, you will receive an invoice from [facility]. Please contact [facility] at [phone] with questions or concerns regarding your invoice.   IF you received labwork today, you will receive an invoice from [facility]. Please contact [facility] at [phone] with questions or concerns regarding your invoice.   Our billing staff will not be able to assist you with questions regarding bills from these companies.  You will be contacted with the lab results as soon as they are available. The fastest way to get your results is to activate your My Chart account. Instructions are located on the last page of this paperwork. If you have not heard from [place] regarding the results in 2 weeks, please contact this office.

## 2018-10-30 NOTE — Progress Notes (Signed)
Virtual Visit via Video Note  I connected with Drew Miles on 10/30/18 at 12:02 PM by a video enabled telemedicine application and verified that I am speaking with the correct person using two identifiers.   I discussed the limitations, risks, security and privacy concerns of performing an evaluation and management service by telephone and the availability of in person appointments. I also discussed with the patient that there may be a patient responsible charge related to this service. The patient expressed understanding and agreed to proceed, consent obtained  Chief complaint:  HTN, ED, HLD  History of Present Illness: Drew Miles is a 58 y.o. male  Hypertension: BP Readings from Last 3 Encounters:  11/10/17 138/60  09/16/16 99/64  02/11/16 129/89   Lab Results  Component Value Date   CREATININE 0.94 11/10/2017  home readings: 125/79.  Lisinopril 20mg  qd.  ASA 81mg  - no hx of PUD. No FH of CAD - UTD article on ASA sent.  Constitutional: Negative for fatigue and unexpected weight change.  Eyes: Negative for visual disturbance.  Respiratory: Negative for cough, chest tightness and shortness of breath.   Cardiovascular: Negative for chest pain, palpitations and leg swelling.  Gastrointestinal: Negative for abdominal pain and blood in stool.  Neurological: Negative for dizziness, light-headedness and headaches.    Hyperlipidemia:  Lab Results  Component Value Date   CHOL 112 11/10/2017   HDL 33 (L) 11/10/2017   LDLCALC 59 11/10/2017   TRIG 98 11/10/2017   CHOLHDL 3.4 11/10/2017   Lab Results  Component Value Date   ALT 25 11/10/2017   AST 35 11/10/2017   ALKPHOS 67 11/10/2017   BILITOT 0.5 11/10/2017  Crestor 10mg  QD.  No new myalgias.   Erectile dysfunction: Sildenafil 20mg  - has been effective at 10mg  at at times.  No vision or hearing changes, no CP with exercise/exertion.   GERD Omeprazole daily. Controls heartburn.  Has not tried spacing meds recently.  On  otc vit D 1000u per day.  No history of peptic ulcer disease, no blood in stools.  R Middle toe injury.  5-6 months ago - R middle toe, stubbed on dresser.  Buddy taped.  Still some soreness. No apparent deformity.  Would like to wait awhile prior to eval.     Patient Active Problem List   Diagnosis Date Noted  . Persistent headaches 10/31/2014  . Obesity, unspecified 02/06/2013  . Hypertension 02/01/2012  . Hyperlipidemia 02/01/2012  . Reflux 02/01/2012  . Abnormal CT scan 02/01/2012   Past Medical History:  Diagnosis Date  . Allergy   . Hyperlipidemia   . Hypertension    Past Surgical History:  Procedure Laterality Date  . SHOULDER SURGERY  2013  . VASECTOMY     Allergies  Allergen Reactions  . Anesthetics, Ester Other (See Comments)    INCREASED BP   Prior to Admission medications   Medication Sig Start Date End Date Taking? Authorizing Provider  aspirin EC 81 MG tablet Take 81 mg by mouth daily.   Yes [provider]  Cholecalciferol (VITAMIN D) 400 UNITS capsule Take 400 Units by mouth daily.   Yes [provider]  fish oil-omega-3 fatty acids 1000 MG capsule Take 2 g by mouth daily.   Yes [provider]  lisinopril (ZESTRIL) 20 MG tablet TAKE 1 TABLET (20 MG TOTAL) BY MOUTH DAILY. T 10/02/18  Yes Shade FloodGreene, Halimah Bewick R, MD  Multiple Vitamin (MULTIVITAMIN) tablet Take 1 tablet by mouth daily.   Yes [provider]  omeprazole (PRILOSEC) 20 MG capsule TAKE 1 TABLET BY MOUTH DAILY 11/10/17  Yes Shade Flood, MD  rosuvastatin (CRESTOR) 10 MG tablet TAKE 1 TABLET BY MOUTH EVERY DAY 10/02/18  Yes Shade Flood, MD  sildenafil (REVATIO) 20 MG tablet TAKE 1-2 TABS (20-40MG ) A DAY AS NEEDED PRIOR TO SEXUAL ACTIVITY 03/27/18  Yes Shade Flood, MD   Social History   Socioeconomic History  . Marital status: Married    Spouse name: Drew Miles  . Number of children: 3  . Years of education: Bachelor's  . Highest education level: Not  on file  Occupational History  . Occupation: Conservation officer, nature: [employer]  Social Needs  . Financial resource strain: Not on file  . Food insecurity:    Worry: Not on file    Inability: Not on file  . Transportation needs:    Medical: Not on file    Non-medical: Not on file  Tobacco Use  . Smoking status: Former Smoker    Years: 5.00    Types: Cigarettes    Last attempt to quit: 05/31/1980    Years since quitting: 38.4  . Smokeless tobacco: Never Used  Substance and Sexual Activity  . Alcohol use: No  . Drug use: No  . Sexual activity: Yes  Lifestyle  . Physical activity:    Days per week: Not on file    Minutes per session: Not on file  . Stress: Not on file  Relationships  . Social connections:    Talks on phone: Not on file    Gets together: Not on file    Attends religious service: Not on file    Active member of club or organization: Not on file    Attends meetings of clubs or organizations: Not on file    Relationship status: Not on file  . Intimate partner violence:    Fear of current or ex partner: Not on file    Emotionally abused: Not on file    Physically abused: Not on file    Forced sexual activity: Not on file  Other Topics Concern  . Not on file  Social History Narrative   Lives with wife   Caffeine use: 3-4 diet cokes per day   Married. Education: [employer]. Exercise: No    Observations/Objective:   Assessment and Plan:  Essential hypertension - Plan: lisinopril (ZESTRIL) 20 MG tablet, Comprehensive metabolic panel  -  Stable, tolerating current regimen. Medications refilled. Labs pending as above.   Erectile dysfunction, unspecified erectile dysfunction type - Plan: sildenafil (REVATIO) 20 MG tablet  - sildenafil Rx given - use lowest effective dose. Side effects discussed (including but not limited to headache/flushing, blue discoloration of vision, possible vascular steal and risk of cardiac effects if underlying unknown coronary artery  disease, and permanent sensorineural hearing loss). Understanding expressed.  Gastroesophageal reflux disease, esophagitis presence not specified - Plan: omeprazole (PRILOSEC) 20 MG capsule, Ferritin, Magnesium, Vitamin D, 25-hydroxy, CANCELED: Magnesium, CANCELED: Vitamin D, 25-hydroxy, CANCELED: Ferritin Screening for endocrine, metabolic and immunity disorder - Plan: Ferritin, Magnesium, Vitamin D, 25-hydroxy, CANCELED: Magnesium, CANCELED: Vitamin D, 25-hydroxy, CANCELED: Ferritin Use of proton pump inhibitor therapy - Plan: Ferritin, Magnesium, Vitamin D, 25-hydroxy, CANCELED: Magnesium, CANCELED: Vitamin D, 25-hydroxy, CANCELED: Ferritin  -Stable symptoms with daily dosing of PPI.  Potential side effects and risk discussed, including nutritional deficiencies.  We will screen for those with upcoming blood work.  Option of episodic dosing if tolerated   Hyperlipidemia,  unspecified hyperlipidemia type - Plan: Comprehensive metabolic panel, Lipid panel  -Tolerating Crestor, plan for fasting lab visit and LFT monitoring.  No change in dose for now.  Right middle toe injury, remote.  Still some episodic discomfort.  Option of x-ray followed by in person visit to evaluate further.  He will call when ready.   Follow Up Instructions: Lab only visit within 2 weeks,   I discussed the assessment and treatment plan with the patient. The patient was provided an opportunity to ask questions and all were answered. The patient agreed with the plan and demonstrated an understanding of the instructions.   The patient was advised to call back or seek an in-person evaluation if the symptoms worsen or if the condition fails to improve as anticipated.  I provided 23 minutes of non-face-to-face time during this encounter.   Shade Flood, MD

## 2018-10-30 NOTE — Progress Notes (Signed)
CC- med refill- patient stated he is doing well with no issus at this time- Patient blood pressure this morning was 125/79, pulse- 75, weight -190 and temp 98.1. Patient need refill on  Crestor and sildenafil.

## 2018-11-06 ENCOUNTER — Other Ambulatory Visit: Payer: Self-pay

## 2018-11-06 ENCOUNTER — Ambulatory Visit (INDEPENDENT_AMBULATORY_CARE_PROVIDER_SITE_OTHER): Payer: Federal, State, Local not specified - PPO | Admitting: Family Medicine

## 2018-11-06 DIAGNOSIS — Z1329 Encounter for screening for other suspected endocrine disorder: Secondary | ICD-10-CM

## 2018-11-06 DIAGNOSIS — E785 Hyperlipidemia, unspecified: Secondary | ICD-10-CM

## 2018-11-06 DIAGNOSIS — Z13228 Encounter for screening for other metabolic disorders: Secondary | ICD-10-CM

## 2018-11-06 DIAGNOSIS — K219 Gastro-esophageal reflux disease without esophagitis: Secondary | ICD-10-CM

## 2018-11-06 DIAGNOSIS — Z79899 Other long term (current) drug therapy: Secondary | ICD-10-CM

## 2018-11-06 DIAGNOSIS — I10 Essential (primary) hypertension: Secondary | ICD-10-CM

## 2018-11-06 DIAGNOSIS — Z130 Encounter for screening for diseases of the blood and blood-forming organs and certain disorders involving the immune mechanism: Secondary | ICD-10-CM

## 2018-11-07 LAB — COMPREHENSIVE METABOLIC PANEL
ALT: 25 IU/L (ref 0–44)
AST: 26 IU/L (ref 0–40)
Albumin/Globulin Ratio: 2.3 — ABNORMAL HIGH (ref 1.2–2.2)
Albumin: 4.4 g/dL (ref 3.8–4.9)
Alkaline Phosphatase: 72 IU/L (ref 39–117)
BUN/Creatinine Ratio: 13 (ref 9–20)
BUN: 13 mg/dL (ref 6–24)
Bilirubin Total: 0.4 mg/dL (ref 0.0–1.2)
CO2: 24 mmol/L (ref 20–29)
Calcium: 9.3 mg/dL (ref 8.7–10.2)
Chloride: 106 mmol/L (ref 96–106)
Creatinine, Ser: 0.98 mg/dL (ref 0.76–1.27)
GFR calc Af Amer: 99 mL/min/{1.73_m2} (ref >59)
GFR calc non Af Amer: 85 mL/min/{1.73_m2} (ref >59)
Globulin, Total: 1.9 g/dL (ref 1.5–4.5)
Glucose: 102 mg/dL — ABNORMAL HIGH (ref 65–99)
Potassium: 4.1 mmol/L (ref 3.5–5.2)
Sodium: 145 mmol/L — ABNORMAL HIGH (ref 134–144)
Total Protein: 6.3 g/dL (ref 6.0–8.5)

## 2018-11-07 LAB — LIPID PANEL
Chol/HDL Ratio: 3.7 ratio (ref 0.0–5.0)
Cholesterol, Total: 157 mg/dL (ref 100–199)
HDL: 43 mg/dL (ref >39)
LDL Calculated: 88 mg/dL (ref 0–99)
Triglycerides: 129 mg/dL (ref 0–149)
VLDL Cholesterol Cal: 26 mg/dL (ref 5–40)

## 2018-11-07 LAB — MAGNESIUM: Magnesium: 2.3 mg/dL (ref 1.6–2.3)

## 2018-11-07 LAB — FERRITIN: Ferritin: 108 ng/mL (ref 30–400)

## 2018-11-07 LAB — VITAMIN D 25 HYDROXY (VIT D DEFICIENCY, FRACTURES): Vit D, 25-Hydroxy: 37.9 ng/mL (ref 30.0–100.0)

## 2018-12-25 DIAGNOSIS — L309 Dermatitis, unspecified: Secondary | ICD-10-CM | POA: Diagnosis not present

## 2018-12-25 DIAGNOSIS — L299 Pruritus, unspecified: Secondary | ICD-10-CM | POA: Diagnosis not present

## 2018-12-30 DIAGNOSIS — R21 Rash and other nonspecific skin eruption: Secondary | ICD-10-CM | POA: Diagnosis not present

## 2018-12-30 DIAGNOSIS — L309 Dermatitis, unspecified: Secondary | ICD-10-CM | POA: Diagnosis not present

## 2018-12-30 DIAGNOSIS — L299 Pruritus, unspecified: Secondary | ICD-10-CM | POA: Diagnosis not present

## 2019-02-14 ENCOUNTER — Telehealth (INDEPENDENT_AMBULATORY_CARE_PROVIDER_SITE_OTHER): Payer: Federal, State, Local not specified - PPO | Admitting: Family Medicine

## 2019-02-14 DIAGNOSIS — K219 Gastro-esophageal reflux disease without esophagitis: Secondary | ICD-10-CM

## 2019-02-14 DIAGNOSIS — Z119 Encounter for screening for infectious and parasitic diseases, unspecified: Secondary | ICD-10-CM | POA: Diagnosis not present

## 2019-02-14 MED ORDER — OMEPRAZOLE 20 MG PO CPDR: DELAYED_RELEASE_CAPSULE | ORAL | 3 refills | Status: DC

## 2019-02-14 NOTE — Patient Instructions (Signed)
Gregory for [employer] - order placed.   No change in meds.

## 2019-02-14 NOTE — Progress Notes (Signed)
Virtual Visit via Telephone Note  I connected with Drew Miles on 02/14/19 at 5:53 PM by telephone and verified that I am speaking with the correct person using two identifiers.   I discussed the limitations, risks, security and privacy concerns of performing an evaluation and management service by telephone and the availability of in person appointments. I also discussed with the patient that there may be a patient responsible charge related to this service. The patient expressed understanding and agreed to proceed, consent obtained  Chief complaint: Med refills,  covid test.   History of Present Illness: Drew Miles is a 58 y.o. male   Covid testing: 2 children in [place], other in [place].  Son and DTR in law with 56 month old child will be seeing child. Requested covid test prior to seeing the family. 10/12 visit.  No symptoms  - no cough, fever, no change in taste/smell or GI issues.  No known exposure to Covid.   Hypertension: BP Readings from Last 3 Encounters:  11/10/17 138/60  09/16/16 99/64  02/11/16 129/89   Lab Results  Component Value Date   CREATININE 0.98 11/06/2018  Home readings controlled at telemedicine visit June 1.  Lisinopril 20 mg daily, aspirin 81 mg daily, option of stopping aspirin. Still taking ASA daily.   Hyperlipidemia:  Lab Results  Component Value Date   CHOL 157 11/06/2018   HDL 43 11/06/2018   LDLCALC 88 11/06/2018   TRIG 129 11/06/2018   CHOLHDL 3.7 11/06/2018   Lab Results  Component Value Date   ALT 25 11/06/2018   AST 26 11/06/2018   ALKPHOS 72 11/06/2018   BILITOT 0.4 11/06/2018  Well controlled on labs in June.  Continue Crestor 10 mg daily.  GERD -controlled in June with omeprazole daily.  option of spacing meds. Needs refill of omeprazole.  Tried alternating days.  symptoms returned on off days. so back on daily meds.  Doing better on daily meds.  Hyperglycemia Borderline glucose of 102.  Noted on labs June 8. No new  diet/exercise changes.  Less exercise with heat of summer.  Has appt with me in 05/03/19.      Patient Active Problem List   Diagnosis Date Noted  . Persistent headaches 10/31/2014  . Obesity, unspecified 02/06/2013  . Hypertension 02/01/2012  . Hyperlipidemia 02/01/2012  . Reflux 02/01/2012  . Abnormal CT scan 02/01/2012   Past Medical History:  Diagnosis Date  . Allergy   . Hyperlipidemia   . Hypertension    Past Surgical History:  Procedure Laterality Date  . SHOULDER SURGERY  2013  . VASECTOMY     Allergies  Allergen Reactions  . Anesthetics, Ester Other (See Comments)    INCREASED BP   Prior to Admission medications   Medication Sig Start Date End Date Taking? Authorizing Provider  aspirin EC 81 MG tablet Take 81 mg by mouth daily.   Yes [provider]  Cholecalciferol (VITAMIN D) 400 UNITS capsule Take 1,000 Units by mouth daily.   Yes [provider]  fish oil-omega-3 fatty acids 1000 MG capsule Take 2 g by mouth daily.   Yes [provider]  lisinopril (ZESTRIL) 20 MG tablet Take 1 tablet (20 mg total) by mouth daily. 10/30/18  Yes Wendie Agreste, MD  Multiple Vitamin (MULTIVITAMIN) tablet Take 1 tablet by mouth daily.   Yes [provider]  omeprazole (PRILOSEC) 20 MG capsule TAKE 1 TABLET BY MOUTH DAILY 10/30/18  Yes Wendie Agreste,  MD  rosuvastatin (CRESTOR) 10 MG tablet Take 1 tablet (10 mg total) by mouth daily. 10/30/18  Yes Shade FloodGreene, Hunner Garcon R, MD  sildenafil (REVATIO) 20 MG tablet TAKE 1-2 TABS (20-40MG ) A DAY AS NEEDED PRIOR TO SEXUAL ACTIVITY 10/30/18  Yes Shade FloodGreene, Mounir Skipper R, MD   Social History   Socioeconomic History  . Marital status: Married    Spouse name: Drew Miles  . Number of children: 3  . Years of education: Bachelor's  . Highest education level: Not on file  Occupational History  . Occupation: Conservation officer, natureevenue Agent    Employer: [employer]  Social Needs  . Financial resource strain: Not on file  . Food insecurity     Worry: Not on file    Inability: Not on file  . Transportation needs    Medical: Not on file    Non-medical: Not on file  Tobacco Use  . Smoking status: Former Smoker    Years: 5.00    Types: Cigarettes    Quit date: 05/31/1980    Years since quitting: 38.7  . Smokeless tobacco: Never Used  Substance and Sexual Activity  . Alcohol use: No  . Drug use: No  . Sexual activity: Yes  Lifestyle  . Physical activity    Days per week: Not on file    Minutes per session: Not on file  . Stress: Not on file  Relationships  . Social Musicianconnections    Talks on phone: Not on file    Gets together: Not on file    Attends religious service: Not on file    Active member of club or organization: Not on file    Attends meetings of clubs or organizations: Not on file    Relationship status: Not on file  . Intimate partner violence    Fear of current or ex partner: Not on file    Emotionally abused: Not on file    Physically abused: Not on file    Forced sexual activity: Not on file  Other Topics Concern  . Not on file  Social History Narrative   Lives with wife   Caffeine use: 3-4 diet cokes per day   Married. Education: [employer]. Exercise: No     Observations/Objective:  No distress.  All questions answered.   There were no vitals filed for this visit.  Assessment and Plan: Gastroesophageal reflux disease, esophagitis presence not specified - Plan: omeprazole (PRILOSEC) 20 MG capsule -Did not tolerate space/intermittent dosing.  Continue omeprazole daily.  I did resend prescription as last one in June apparently was not received.  Screening examination for infectious disease - Plan: Novel Coronavirus, NAA ([facility])   -Asymptomatic but needed for family traveling to town.  Test ordered for drive up testing, does plan on contacting his insurance company to verify coverage/cost.  Follow Up Instructions:    I discussed the assessment and treatment plan with the patient. The patient was  provided an opportunity to ask questions and all were answered. The patient agreed with the plan and demonstrated an understanding of the instructions.   The patient was advised to call back or seek an in-person evaluation if the symptoms worsen or if the condition fails to improve as anticipated.  I provided 19 minutes of non-face-to-face time during this encounter.  Signed,   Meredith StaggersJeffrey Mikenzi Raysor, MD [facility].  02/14/19

## 2019-05-03 ENCOUNTER — Ambulatory Visit (INDEPENDENT_AMBULATORY_CARE_PROVIDER_SITE_OTHER): Payer: Federal, State, Local not specified - PPO

## 2019-05-03 ENCOUNTER — Ambulatory Visit: Payer: Federal, State, Local not specified - PPO | Admitting: Family Medicine

## 2019-05-03 ENCOUNTER — Other Ambulatory Visit: Payer: Self-pay

## 2019-05-03 ENCOUNTER — Encounter: Payer: Self-pay | Admitting: Family Medicine

## 2019-05-03 VITALS — BP 130/87 | HR 80 | Temp 98.7°F | Wt 243.6 lb

## 2019-05-03 DIAGNOSIS — M79674 Pain in right toe(s): Secondary | ICD-10-CM

## 2019-05-03 DIAGNOSIS — N529 Male erectile dysfunction, unspecified: Secondary | ICD-10-CM

## 2019-05-03 DIAGNOSIS — E785 Hyperlipidemia, unspecified: Secondary | ICD-10-CM | POA: Diagnosis not present

## 2019-05-03 DIAGNOSIS — I10 Essential (primary) hypertension: Secondary | ICD-10-CM | POA: Diagnosis not present

## 2019-05-03 DIAGNOSIS — K219 Gastro-esophageal reflux disease without esophagitis: Secondary | ICD-10-CM

## 2019-05-03 NOTE — Progress Notes (Signed)
Subjective:  Patient ID: Drew Miles, male    DOB: 10-12-1960  Age: 58 y.o. MRN: [id number]  CC:  Chief Complaint  Patient presents with  . Medical Management of Chronic Issues    6 month f/u on medical condition  . Foot Injury    right foot base of the index toe. Injuried a yr ago stumped toe on dresser. Still having pain in toe    HPI JIMY GATES presents for   Hypertension: Lisinopril 20 mg daily. No new side effects.  Home readings: BP Readings from Last 3 Encounters:  05/03/19 130/87  11/10/17 138/60  09/16/16 99/64   Lab Results  Component Value Date   CREATININE 0.98 11/06/2018    Hyperlipidemia: Crestor 10 mg daily, fish oil.  Aspirin 81 mg daily. No new myalgias/side effects.  Lab Results  Component Value Date   CHOL 157 11/06/2018   HDL 43 11/06/2018   LDLCALC 88 11/06/2018   TRIG 129 11/06/2018   CHOLHDL 3.7 11/06/2018   Lab Results  Component Value Date   ALT 25 11/06/2018   AST 26 11/06/2018   ALKPHOS 72 11/06/2018   BILITOT 0.4 11/06/2018   GERD: Prilosec 20 mg daily. Controlling symptoms - did not tolerate every other day. Trying to avoid trigger foods.   Erectile dysfunction: Sildenafil 20 mg as needed - 1/2 pill works.  No vision/hearing change. No chest pain.   Right second toe pain Started 1 year ago, stumped toe on dresser. Discoloration and swelling initially. Treated with buddy tape.  Swelling resolved, still sore. Not limiting activity. Episodic pain. Pain into midofft at times. No prior xray.    History Patient Active Problem List   Diagnosis Date Noted  . Persistent headaches 10/31/2014  . Obesity, unspecified 02/06/2013  . Hypertension 02/01/2012  . Hyperlipidemia 02/01/2012  . Reflux 02/01/2012  . Abnormal CT scan 02/01/2012   Past Medical History:  Diagnosis Date  . Allergy   . Hyperlipidemia   . Hypertension    Past Surgical History:  Procedure Laterality Date  . SHOULDER SURGERY  2013  . VASECTOMY      Allergies  Allergen Reactions  . Anesthetics, Ester Other (See Comments)    INCREASED BP   Prior to Admission medications   Medication Sig Start Date End Date Taking? Authorizing Provider  aspirin EC 81 MG tablet Take 81 mg by mouth daily.   Yes [provider]  Cholecalciferol (VITAMIN D) 400 UNITS capsule Take 1,000 Units by mouth daily.   Yes [provider]  fish oil-omega-3 fatty acids 1000 MG capsule Take 2 g by mouth daily.   Yes [provider]  lisinopril (ZESTRIL) 20 MG tablet Take 1 tablet (20 mg total) by mouth daily. 10/30/18  Yes Wendie Agreste, MD  Multiple Vitamin (MULTIVITAMIN) tablet Take 1 tablet by mouth daily.   Yes [provider]  omeprazole (PRILOSEC) 20 MG capsule TAKE 1 TABLET BY MOUTH DAILY 02/14/19  Yes Wendie Agreste, MD  rosuvastatin (CRESTOR) 10 MG tablet Take 1 tablet (10 mg total) by mouth daily. 10/30/18  Yes Wendie Agreste, MD  sildenafil (REVATIO) 20 MG tablet TAKE 1-2 TABS (20-40MG ) A DAY AS NEEDED PRIOR TO SEXUAL ACTIVITY 10/30/18  Yes Wendie Agreste, MD   Social History   Socioeconomic History  . Marital status: Married    Spouse name: Marliss Coots  . Number of children: 3  . Years of education: Bachelor's  . Highest education level: Not on  file  Occupational History  . Occupation: Conservation officer, nature: [employer]  Social Needs  . Financial resource strain: Not on file  . Food insecurity    Worry: Not on file    Inability: Not on file  . Transportation needs    Medical: Not on file    Non-medical: Not on file  Tobacco Use  . Smoking status: Former Smoker    Years: 5.00    Types: Cigarettes    Quit date: 05/31/1980    Years since quitting: 38.9  . Smokeless tobacco: Never Used  Substance and Sexual Activity  . Alcohol use: No  . Drug use: No  . Sexual activity: Yes  Lifestyle  . Physical activity    Days per week: Not on file    Minutes per session: Not on file  . Stress: Not on file   Relationships  . Social Musician on phone: Not on file    Gets together: Not on file    Attends religious service: Not on file    Active member of club or organization: Not on file    Attends meetings of clubs or organizations: Not on file    Relationship status: Not on file  . Intimate partner violence    Fear of current or ex partner: Not on file    Emotionally abused: Not on file    Physically abused: Not on file    Forced sexual activity: Not on file  Other Topics Concern  . Not on file  Social History Narrative   Lives with wife   Caffeine use: 3-4 diet cokes per day   Married. Education: [employer]. Exercise: No    Review of Systems  Constitutional: Negative for fatigue and unexpected weight change.  Eyes: Negative for visual disturbance.  Respiratory: Negative for cough, chest tightness and shortness of breath.   Cardiovascular: Negative for chest pain, palpitations and leg swelling.  Gastrointestinal: Negative for abdominal pain and blood in stool.  Neurological: Negative for dizziness, light-headedness and headaches.     Objective:   Vitals:   05/03/19 0807  BP: 130/87  Pulse: 80  Temp: 98.7 F (37.1 C)  TempSrc: Oral  SpO2: 97%  Weight: 243 lb 9.6 oz (110.5 kg)     Physical Exam Vitals signs reviewed.  Constitutional:      Appearance: He is well-developed.  HENT:     Head: Normocephalic and atraumatic.  Eyes:     Pupils: Pupils are equal, round, and reactive to light.  Neck:     Vascular: No carotid bruit or JVD.  Cardiovascular:     Rate and Rhythm: Normal rate and regular rhythm.     Heart sounds: Normal heart sounds. No murmur.  Pulmonary:     Effort: Pulmonary effort is normal.     Breath sounds: Normal breath sounds. No rales.  Musculoskeletal:     Right foot: Decreased range of motion. Tenderness and bony tenderness present.       Feet:  Skin:    General: Skin is warm and dry.  Neurological:     Mental Status: He is alert  and oriented to person, place, and time.     Dg Toe 2nd Right  Result Date: 05/03/2019 CLINICAL DATA:  Injured second toe 1 year ago.  Persistent pain. EXAM: RIGHT SECOND TOE COMPARISON:  None. FINDINGS: The joint spaces are maintained. No acute bony abnormality or bone lesion. No degenerative changes. IMPRESSION: No acute bony findings or  degenerative changes. Electronically Signed   By: Rudie MeyerP.  Gallerani M.D.   On: 05/03/2019 09:10      Assessment & Plan:  Delories Heinzric O Mcgahan is a 58 y.o. male . Essential hypertension - Plan: Comprehensive metabolic panel  -Stable, check labs, continue same meds  Hyperlipidemia, unspecified hyperlipidemia type - Plan: Lipid Panel, Comprehensive metabolic panel  -Tolerating current regimen, check labs.  Continue same.   Erectile dysfunction, unspecified erectile dysfunction type  -Well-controlled with low-dose of sildenafil, Rx given - use lowest effective dose. Side effects discussed (including but not limited to headache/flushing, blue discoloration of vision, possible vascular steal and risk of cardiac effects if underlying unknown coronary artery disease, and permanent sensorineural hearing loss). Understanding expressed.  GERD, stable with daily dosing.  Ferritin, magnesium, vitamin D ok in June.  Pain of toe of right foot - Plan: DG Toe 2nd Right, Ambulatory referral to Podiatry  -Decreased motion, check x-ray.  Proximal pain/pain between second and third toes could be Morton's neuroma.  Refer to podiatry for evaluation as injection may also be possible.  No orders of the defined types were placed in this encounter.  Patient Instructions     No change in meds at this time.  I will check some blood work.  I will refer you to podiatry, and let you know if there are any concerns on x-ray.  Thank you for coming in today.  If you have lab work done today you will be contacted with your lab results within the next 2 weeks.  If you have not heard from us then  please contact us. The fastest way to get your results is to register for My Chart.   IF you received an x-ray today, you will receive an invoice from [facility]. Please contact [facility] at [phone] with questions or concerns regarding your invoice.   IF you received labwork today, you will receive an invoice from [facility]. Please contact [facility] at [phone] with questions or concerns regarding your invoice.   Our billing staff will not be able to assist you with questions regarding bills from these companies.  You will be contacted with the lab results as soon as they are available. The fastest way to get your results is to activate your My Chart account. Instructions are located on the last page of this paperwork. If you have not heard from us regarding the results in 2 weeks, please contact this office.          Signed, Meredith StaggersJeffrey Zarianna Dicarlo, MD [facility]

## 2019-05-03 NOTE — Patient Instructions (Addendum)
   No change in meds at this time.  I will check some blood work.  I will refer you to podiatry, and let you know if there are any concerns on x-ray.  Thank you for coming in today.  If you have lab work done today you will be contacted with your lab results within the next 2 weeks.  If you have not heard from [place] then please contact us. The fastest way to get your results is to register for My Chart.   IF you received an x-ray today, you will receive an invoice from [facility]. Please contact [facility] at [phone] with questions or concerns regarding your invoice.   IF you received labwork today, you will receive an invoice from [place]. Please contact [facility] at [phone] with questions or concerns regarding your invoice.   Our billing staff will not be able to assist you with questions regarding bills from these companies.  You will be contacted with the lab results as soon as they are available. The fastest way to get your results is to activate your My Chart account. Instructions are located on the last page of this paperwork. If you have not heard from [place] regarding the results in 2 weeks, please contact this office.

## 2019-05-04 LAB — LIPID PANEL
Chol/HDL Ratio: 4.6 ratio (ref 0.0–5.0)
Cholesterol, Total: 180 mg/dL (ref 100–199)
HDL: 39 mg/dL — ABNORMAL LOW (ref >39)
LDL Chol Calc (NIH): 112 mg/dL — ABNORMAL HIGH (ref 0–99)
Triglycerides: 163 mg/dL — ABNORMAL HIGH (ref 0–149)
VLDL Cholesterol Cal: 29 mg/dL (ref 5–40)

## 2019-05-09 ENCOUNTER — Ambulatory Visit: Payer: Federal, State, Local not specified - PPO | Admitting: Podiatry

## 2019-05-11 ENCOUNTER — Encounter: Payer: Self-pay | Admitting: Podiatry

## 2019-05-11 ENCOUNTER — Ambulatory Visit (INDEPENDENT_AMBULATORY_CARE_PROVIDER_SITE_OTHER): Payer: Federal, State, Local not specified - PPO

## 2019-05-11 ENCOUNTER — Ambulatory Visit (INDEPENDENT_AMBULATORY_CARE_PROVIDER_SITE_OTHER): Payer: Federal, State, Local not specified - PPO | Admitting: Podiatry

## 2019-05-11 ENCOUNTER — Other Ambulatory Visit: Payer: Self-pay

## 2019-05-11 DIAGNOSIS — M722 Plantar fascial fibromatosis: Secondary | ICD-10-CM

## 2019-05-11 DIAGNOSIS — M779 Enthesopathy, unspecified: Secondary | ICD-10-CM | POA: Diagnosis not present

## 2019-05-11 MED ORDER — DICLOFENAC SODIUM 75 MG PO TBEC: 75.0000 mg | DELAYED_RELEASE_TABLET | Freq: Two times a day (BID) | ORAL | 2 refills | Status: DC

## 2019-05-11 NOTE — Patient Instructions (Signed)

## 2019-05-11 NOTE — Progress Notes (Signed)
   Subjective:    Patient ID: Drew Miles, male    DOB: May 10, 1961, 58 y.o.   MRN: [id number]  HPI    Review of Systems  All other systems reviewed and are negative.      Objective:   Physical Exam        Assessment & Plan:

## 2019-05-11 NOTE — Progress Notes (Signed)
Subjective:   Patient ID: Drew Miles, male   DOB: 58 y.o.   MRN: [id number]   HPI Patient presents stating that he has a lot of pain in his right forefoot after an injury a year ago and has chronic heel pain of both heels with the right being worse currently.  States that that is an ongoing issue and has had it treated in the past and feels like he needs more support.  Patient does not smoke likes to be active   Review of Systems  All other systems reviewed and are negative.       Objective:  Physical Exam Vitals and nursing note reviewed.  Constitutional:      Appearance: He is well-developed.  Pulmonary:     Effort: Pulmonary effort is normal.  Musculoskeletal:        General: Normal range of motion.  Skin:    General: Skin is warm.  Neurological:     Mental Status: He is alert.     Neurovascular status intact muscle strength found to be adequate range of motion within normal limits.  Patient is noted to have quite a bit of discomfort in the right second MPJ with inflammation fluid around the joint surface and is noted to have discomfort in the heel region right over left with moderate depression of the arch noted bilateral.  Patient is found to have good digital perfusion well oriented x3     Assessment:  Acute capsulitis second MPJ right with possible flexor plate damage and plantar fasciitis right upper left     Plan:  H&P conditions reviewed and today I did a forefoot block and then aspirated the second MPJ getting out a small amount of clear fluid and injected quarter cc dexamethasone Kenalog into the joint and I did a sterile prep and injected the plantar fascial right 3 mg Kenalog 5 mg Xylocaine applied fascial brace.  Gave instructions on supportive rigid bottom shoes and I placed on diclofenac 75 mg twice daily and reappoint for [place] to reevaluate.  Discussed long-term orthotics which I think will be in his benefit  X-rays indicate spur formation bilateral plantar  heels with moderate depression of the arch bilateral and no indication damage second MPJ right

## 2019-05-15 ENCOUNTER — Encounter: Payer: Self-pay | Admitting: Family Medicine

## 2019-05-15 DIAGNOSIS — E785 Hyperlipidemia, unspecified: Secondary | ICD-10-CM

## 2019-05-16 MED ORDER — ROSUVASTATIN CALCIUM 20 MG PO TABS: 20.0000 mg | ORAL_TABLET | Freq: Every day | ORAL | 1 refills | Status: DC

## 2019-05-18 ENCOUNTER — Encounter: Payer: Self-pay | Admitting: Podiatry

## 2019-05-18 ENCOUNTER — Ambulatory Visit (INDEPENDENT_AMBULATORY_CARE_PROVIDER_SITE_OTHER): Payer: Federal, State, Local not specified - PPO | Admitting: Podiatry

## 2019-05-18 ENCOUNTER — Other Ambulatory Visit: Payer: Self-pay

## 2019-05-18 DIAGNOSIS — M779 Enthesopathy, unspecified: Secondary | ICD-10-CM

## 2019-05-18 DIAGNOSIS — M722 Plantar fascial fibromatosis: Secondary | ICD-10-CM

## 2019-05-18 DIAGNOSIS — Z20828 Contact with and (suspected) exposure to other viral communicable diseases: Secondary | ICD-10-CM | POA: Diagnosis not present

## 2019-05-18 DIAGNOSIS — I10 Essential (primary) hypertension: Secondary | ICD-10-CM | POA: Diagnosis not present

## 2019-05-18 DIAGNOSIS — Z03818 Encounter for observation for suspected exposure to other biological agents ruled out: Secondary | ICD-10-CM | POA: Diagnosis not present

## 2019-05-21 NOTE — Progress Notes (Signed)
Subjective:   Patient ID: Drew Miles, male   DOB: 58 y.o.   MRN: [id number]   HPI Patient states that he seemed to be better and still having mild discomfort but I still feel better than I did at last visit   ROS      Objective:  Physical Exam  Neurovascular status found to be intact muscle strength adequate with patient found to have significant reduction of discomfort plantar fascial right at the insertion tendon calcaneus with inflammation fluid still noted but improved from previous     Assessment:  Significant improvement from plantar fasciitis right     Plan:  H&P reviewed condition and at this point I have recommended continuation of physical therapy anti-inflammatories and I did dispense a premolded type insole to provide for support.  Reappoint as needed

## 2019-07-13 ENCOUNTER — Other Ambulatory Visit: Payer: Self-pay | Admitting: Podiatry

## 2019-07-24 DIAGNOSIS — Z20828 Contact with and (suspected) exposure to other viral communicable diseases: Secondary | ICD-10-CM | POA: Diagnosis not present

## 2019-07-24 DIAGNOSIS — Z03818 Encounter for observation for suspected exposure to other biological agents ruled out: Secondary | ICD-10-CM | POA: Diagnosis not present

## 2019-09-20 ENCOUNTER — Encounter: Payer: Self-pay | Admitting: Podiatry

## 2019-09-20 ENCOUNTER — Ambulatory Visit: Payer: Federal, State, Local not specified - PPO | Admitting: Podiatry

## 2019-09-20 ENCOUNTER — Other Ambulatory Visit: Payer: Self-pay

## 2019-09-20 VITALS — Temp 97.6°F

## 2019-09-20 DIAGNOSIS — M722 Plantar fascial fibromatosis: Secondary | ICD-10-CM | POA: Diagnosis not present

## 2019-09-20 NOTE — Progress Notes (Signed)
Subjective:   Patient ID: Drew Miles, male   DOB: 58 y.o.   MRN: [id number]   HPI patient states his heel started to hurt him again with worsening pain over the last month.  States it is worse when he gets up in the morning after periods of sitting with the right being worse and states that he does not stretch it probably as much as he should and he did get over-the-counter insoles   ROS      Objective:  Physical Exam  neurovascular status intact with exquisite discomfort plantar fascial right over left with inflammation fluid bilateral with patient noted to have moderate to significant flatfoot deformity and mild obesity     Assessment:  Acute plantar fasciitis bilateral mild obesity is complicating factor     Plan:  H&P condition reviewed and explained.  Today I did go ahead I reinjected the plantar fascial bilateral 3 mg Kenalog 5 mg Xylocaine I applied night splint with all instructions on usage along with anti-inflammatories to be continued and physical therapy.  Discussed long-term orthotics I think it will be in his best interest for the long-term

## 2019-09-21 ENCOUNTER — Other Ambulatory Visit: Payer: Self-pay | Admitting: Podiatry

## 2019-10-04 ENCOUNTER — Other Ambulatory Visit: Payer: Self-pay

## 2019-10-04 ENCOUNTER — Encounter: Payer: Self-pay | Admitting: Podiatry

## 2019-10-04 ENCOUNTER — Ambulatory Visit: Payer: Federal, State, Local not specified - PPO | Admitting: Podiatry

## 2019-10-04 VITALS — Temp 97.7°F

## 2019-10-04 DIAGNOSIS — M779 Enthesopathy, unspecified: Secondary | ICD-10-CM

## 2019-10-04 DIAGNOSIS — M722 Plantar fascial fibromatosis: Secondary | ICD-10-CM | POA: Diagnosis not present

## 2019-10-07 ENCOUNTER — Encounter: Payer: Self-pay | Admitting: Podiatry

## 2019-10-07 NOTE — Progress Notes (Signed)
Subjective:   Patient ID: Drew Miles, male   DOB: 59 y.o.   MRN: [id number]   HPI Patient states he continues to have a lot of problems with his heel right over left and states it is been inflamed and its not terrible but it is bothersome   ROS      Objective:  Physical Exam  Neurovascular status intact with chronic problems with the plantar fascial right over left that has been worked on and continues to persist despite treatment and is been present for a number of years with pain upon palpation      Assessment:  Chronic fasciitis right over left that is been present a long time and is not severely acute now but present     Plan:  H&P reviewed condition at great length.  I do think that the best chance we have conservatively will be aggressive immobilization along with medication and I educated him on this the fact it is chronic and the fact ultimately may require surgical intervention.  At this point I did go ahead I injected the plantar fascial 3 mg Kenalog 5 mg Xylocaine I discussed boot usage and he wants this but then he denies wanting that and he is scheduled to be seen back in 4 weeks and we will see the results of the treatment we did today

## 2019-10-24 ENCOUNTER — Other Ambulatory Visit: Payer: Self-pay | Admitting: Family Medicine

## 2019-10-24 DIAGNOSIS — I10 Essential (primary) hypertension: Secondary | ICD-10-CM

## 2019-10-24 NOTE — Telephone Encounter (Signed)
Requested medications are due for refill today?  Yes  Requested medications are on active medication list?  Yes  Last Refill:   10/30/2018  # 90 with 3 refills   Future visit scheduled?  No    Notes to Clinic:  Medication failed RX refill protocol due to no labs within the past 180 days.  Last labs were performed on 11/06/2018.

## 2019-10-24 NOTE — Telephone Encounter (Signed)
[place] refill given for a month.   Pt needs to have a f/u appt pt was last seen 05/2019. Pt needed a 6 m f/u appt for BP.

## 2019-10-24 NOTE — Telephone Encounter (Signed)
Spoke with pt and scheduled appt °

## 2019-10-31 ENCOUNTER — Ambulatory Visit: Payer: Federal, State, Local not specified - PPO | Admitting: Family Medicine

## 2019-10-31 ENCOUNTER — Encounter: Payer: Self-pay | Admitting: Podiatry

## 2019-10-31 ENCOUNTER — Other Ambulatory Visit: Payer: Self-pay

## 2019-10-31 ENCOUNTER — Encounter: Payer: Self-pay | Admitting: Family Medicine

## 2019-10-31 ENCOUNTER — Ambulatory Visit (INDEPENDENT_AMBULATORY_CARE_PROVIDER_SITE_OTHER): Payer: Federal, State, Local not specified - PPO | Admitting: Podiatry

## 2019-10-31 VITALS — BP 127/84 | HR 78 | Temp 98.2°F | Ht 69.0 in | Wt 247.0 lb

## 2019-10-31 DIAGNOSIS — E785 Hyperlipidemia, unspecified: Secondary | ICD-10-CM | POA: Diagnosis not present

## 2019-10-31 DIAGNOSIS — I10 Essential (primary) hypertension: Secondary | ICD-10-CM | POA: Diagnosis not present

## 2019-10-31 DIAGNOSIS — K219 Gastro-esophageal reflux disease without esophagitis: Secondary | ICD-10-CM

## 2019-10-31 DIAGNOSIS — N529 Male erectile dysfunction, unspecified: Secondary | ICD-10-CM | POA: Diagnosis not present

## 2019-10-31 DIAGNOSIS — M722 Plantar fascial fibromatosis: Secondary | ICD-10-CM | POA: Diagnosis not present

## 2019-10-31 DIAGNOSIS — R195 Other fecal abnormalities: Secondary | ICD-10-CM

## 2019-10-31 MED ORDER — LISINOPRIL 20 MG PO TABS: 20.0000 mg | ORAL_TABLET | Freq: Every day | ORAL | 3 refills | Status: DC

## 2019-10-31 MED ORDER — ROSUVASTATIN CALCIUM 20 MG PO TABS: 20.0000 mg | ORAL_TABLET | Freq: Every day | ORAL | 1 refills | Status: DC

## 2019-10-31 MED ORDER — SILDENAFIL CITRATE 20 MG PO TABS: ORAL_TABLET | ORAL | 11 refills | Status: DC

## 2019-10-31 MED ORDER — OMEPRAZOLE 20 MG PO CPDR: DELAYED_RELEASE_CAPSULE | ORAL | 3 refills | Status: DC

## 2019-10-31 NOTE — Telephone Encounter (Signed)
FYI for you. 

## 2019-10-31 NOTE — Patient Instructions (Addendum)
Pepcid in evening if needed for breakthrough heartburn. Continue prilosec daily.  If recurrent oil in stool, or change in bowel movements, let me know.   No other med changes for now.    If you have lab work done today you will be contacted with your lab results within the next 2 weeks.  If you have not heard from [place] then please contact us. The fastest way to get your results is to register for My Chart.   IF you received an x-ray today, you will receive an invoice from [facility]. Please contact [facility] at [phone] with questions or concerns regarding your invoice.   IF you received labwork today, you will receive an invoice from [place]. Please contact [facility] at [phone] with questions or concerns regarding your invoice.   Our billing staff will not be able to assist you with questions regarding bills from these companies.  You will be contacted with the lab results as soon as they are available. The fastest way to get your results is to activate your My Chart account. Instructions are located on the last page of this paperwork. If you have not heard from [place] regarding the results in 2 weeks, please contact this office.

## 2019-10-31 NOTE — Progress Notes (Signed)
Subjective:   Patient ID: Drew Miles, male   DOB: 59 y.o.   MRN: [id number]   HPI Patient states the boot has helped me but I have not yet gone without it and I am concerned when I start   ROS      Objective:  Physical Exam  Neurovascular status intact with patient's right heel improved with discomfort only upon deep palpation with a diminished fat pad noted F2     Assessment:  Fasciitis-like symptoms that are improving with immobilization     Plan:  Reviewed condition at great length and discussed different treatment options and the fact that if symptoms were to get worse were getting need to consider surgical intervention in this case.  Patient at this point will slowly get off the boot continue to use stretching ice and will be seen back to recheck

## 2019-10-31 NOTE — Progress Notes (Addendum)
Subjective:  Patient ID: Drew Miles, male    DOB: 1960-07-08  Age: 59 y.o. MRN: [id number]  CC:  Chief Complaint  Patient presents with  . Follow-up    on hypertension, hyperlipidemia, GERD, and Erectile disfunction. pt reports no issues with BP since last visit. pt checks BP on a monthly basis.pt reports no newphysical symptoms of hypertension or hyperlipidemia. pt sometimes has issues with his GERD, but not offten. pt reports that with medication he hasn't had much trouble with his ED.   . privet matter    pt has a question he would perfer to ask doctor about privetly.    HPI TOMI PADDOCK presents for   Hypertension: Lisinopril 20 mg.  On aspirin 81 mg daily.  R/b of asa use discussed prior-  Denies new questions.  Home readings: 130's. Stable.  No new side effects.   BP Readings from Last 3 Encounters:  10/31/19 127/84  05/03/19 130/87  11/10/17 138/60   Lab Results  Component Value Date   CREATININE 0.98 11/06/2018   Hyperlipidemia: Crestor 20 mg daily.  Increased from 10 mg in December with mild elevation. No new side effects, no new myalgias.   Lab Results  Component Value Date   CHOL 180 05/03/2019   HDL 39 (L) 05/03/2019   LDLCALC 112 (H) 05/03/2019   TRIG 163 (H) 05/03/2019   CHOLHDL 4.6 05/03/2019   Lab Results  Component Value Date   ALT 25 11/06/2018   AST 26 11/06/2018   ALKPHOS 72 11/06/2018   BILITOT 0.4 11/06/2018    GERD: Prilosec 20 mg daily. Daily.  Working well, rare breakthrough heartburn.   Erectile dysfunction Treated with sildenafil 20 mg - 1/2 -1 works well.   No vision/hearing changes or chest pain with exercise.   Oil in stool Ate salmon for dinner. Noticed oil droplets in BM next am. Otherwise normal stool. No recurrence. No bleeding. Solid stool. Not floating. No straining. BM's normal since. Has changed diet some to lose weight - less fatty food. No n/v/abd pain.  History Patient Active Problem List   Diagnosis Date Noted   . Lateral epicondylitis of right elbow 02/27/2018  . Adhesive capsulitis of shoulder 02/27/2018  . Shoulder joint pain 02/27/2018  . Obesity, unspecified 02/06/2013  . Hypertension 02/01/2012  . Hyperlipidemia 02/01/2012  . Reflux 02/01/2012  . Abnormal CT scan 02/01/2012   Past Medical History:  Diagnosis Date  . Allergy   . Hyperlipidemia   . Hypertension    Past Surgical History:  Procedure Laterality Date  . SHOULDER SURGERY  2013  . VASECTOMY     Allergies  Allergen Reactions  . Ether Other (See Comments)  . Anesthetics, Ester Other (See Comments)    INCREASED BP   Prior to Admission medications   Medication Sig Start Date End Date Taking? Authorizing Provider  aspirin EC 81 MG tablet Take 81 mg by mouth daily.   Yes [provider]  Cholecalciferol (VITAMIN D) 400 UNITS capsule Take 1,000 Units by mouth daily.   Yes [provider]  diclofenac (VOLTAREN) 75 MG EC tablet TAKE 1 TABLET BY MOUTH TWICE A DAY 09/24/19  Yes Regal, Kirstie Peri, DPM  fish oil-omega-3 fatty acids 1000 MG capsule Take 2 g by mouth daily.   Yes [provider]  lisinopril (ZESTRIL) 20 MG tablet Take 1 tablet (20 mg total) by mouth daily. 10/30/18  Yes Shade Flood, MD  Multiple Vitamin (MULTIVITAMIN) tablet Take 1 tablet  by mouth daily.   Yes [provider]  omeprazole (PRILOSEC) 20 MG capsule TAKE 1 TABLET BY MOUTH DAILY 02/14/19  Yes Shade Flood, MD  rosuvastatin (CRESTOR) 20 MG tablet Take 1 tablet (20 mg total) by mouth daily. 05/16/19  Yes Shade Flood, MD  sildenafil (REVATIO) 20 MG tablet TAKE 1-2 TABS (20-40MG ) A DAY AS NEEDED PRIOR TO SEXUAL ACTIVITY 10/30/18  Yes Shade Flood, MD   Social History   Socioeconomic History  . Marital status: Married    Spouse name: [name]  . Number of children: 3  . Years of education: Bachelor's  . Highest education level: Not on file  Occupational History  . Occupation: Conservation officer, nature:  [employer]  Tobacco Use  . Smoking status: Former Smoker    Years: 5.00    Types: Cigarettes    Quit date: 05/31/1980    Years since quitting: 39.4  . Smokeless tobacco: Never Used  Substance and Sexual Activity  . Alcohol use: No  . Drug use: No  . Sexual activity: Yes  Other Topics Concern  . Not on file  Social History Narrative   Lives with wife   Caffeine use: 3-4 diet cokes per day   Married. Education: [employer]. Exercise: No   Social Determinants of Health   Financial Resource Strain:   . Difficulty of Paying Living Expenses:   Food Insecurity:   . Worried About Programme researcher, broadcasting/film/video in the Last Year:   . Barista in the Last Year:   Transportation Needs:   . Freight forwarder (Medical):   Marland Kitchen Lack of Transportation (Non-Medical):   Physical Activity:   . Days of Exercise per Week:   . Minutes of Exercise per Session:   Stress:   . Feeling of Stress :   Social Connections:   . Frequency of Communication with Friends and Family:   . Frequency of Social Gatherings with Friends and Family:   . Attends Religious Services:   . Active Member of Clubs or Organizations:   . Attends Banker Meetings:   Marland Kitchen Marital Status:   Intimate Partner Violence:   . Fear of Current or Ex-Partner:   . Emotionally Abused:   Marland Kitchen Physically Abused:   . Sexually Abused:     Review of Systems  Constitutional: Negative for fatigue and unexpected weight change.  Eyes: Negative for visual disturbance.  Respiratory: Negative for cough, chest tightness and shortness of breath.   Cardiovascular: Negative for chest pain, palpitations and leg swelling.  Gastrointestinal: Negative for abdominal pain and blood in stool.  Neurological: Negative for dizziness, light-headedness and headaches.     Objective:   Vitals:   10/31/19 1105  BP: 127/84  Pulse: 78  Temp: 98.2 F (36.8 C)  TempSrc: Temporal  SpO2: 96%  Weight: 247 lb (112 kg)  Height: 5\' 9"  (1.753 m)    Physical  Exam Vitals reviewed.  Constitutional:      Appearance: He is well-developed.  HENT:     Head: Normocephalic and atraumatic.  Eyes:     Pupils: Pupils are equal, round, and reactive to light.  Neck:     Vascular: No carotid bruit or JVD.  Cardiovascular:     Rate and Rhythm: Normal rate and regular rhythm.     Heart sounds: Normal heart sounds. No murmur.  Pulmonary:     Effort: Pulmonary effort is normal.     Breath sounds: Normal breath  sounds. No rales.  Skin:    General: Skin is warm and dry.  Neurological:     Mental Status: He is alert and oriented to person, place, and time.     Assessment & Plan:  KASTIN CERDA is a 59 y.o. male . Essential hypertension - Plan: Lipid panel, Comprehensive metabolic panel, lisinopril (ZESTRIL) 20 MG tablet  -  Stable, tolerating current regimen. Medications refilled. Labs pending as above.   Hyperlipidemia, unspecified hyperlipidemia type - Plan: Lipid panel, Comprehensive metabolic panel  -  Stable, tolerating current regimen. Medications refilled. Labs pending as above.   Gastroesophageal reflux disease, unspecified whether esophagitis present - Plan: omeprazole (PRILOSEC) 20 MG capsule  -Rare breakthrough symptoms, continue omeprazole 20 mg daily, option of Pepcid over-the-counter in the evenings if needed.   Erectile dysfunction, unspecified erectile dysfunction type - Plan: sildenafil (REVATIO) 20 MG tablet  -Sildenafil Rx given - use lowest effective dose. Side effects discussed (including but not limited to headache/flushing, blue discoloration of vision, possible vascular steal and risk of cardiac effects if underlying unknown coronary artery disease, and permanent sensorineural hearing loss). Understanding expressed.  Change in stool  -Single episode of oil appearance in stool.  May have been related to food.  As this has not been recurrent, will hold on further work-up at this time, but consider gastroenterology eval if persistent  change in stools.  Meds ordered this encounter  Medications  . lisinopril (ZESTRIL) 20 MG tablet    Sig: Take 1 tablet (20 mg total) by mouth daily.    Dispense:  90 tablet    Refill:  3  . omeprazole (PRILOSEC) 20 MG capsule    Sig: TAKE 1 TABLET BY MOUTH DAILY    Dispense:  90 capsule    Refill:  3  . sildenafil (REVATIO) 20 MG tablet    Sig: TAKE 1-2 TABS (20-40MG ) A DAY AS NEEDED PRIOR TO SEXUAL ACTIVITY    Dispense:  10 tablet    Refill:  11    Please consider 90 day supplies to promote better adherence  . rosuvastatin (CRESTOR) 20 MG tablet    Sig: Take 1 tablet (20 mg total) by mouth daily.    Dispense:  90 tablet    Refill:  1   Patient Instructions   Pepcid in evening if needed for breakthrough heartburn. Continue prilosec daily.  If recurrent oil in stool, or change in bowel movements, let me know.   No other med changes for now.    If you have lab work done today you will be contacted with your lab results within the next 2 weeks.  If you have not heard from [place] then please contact us. The fastest way to get your results is to register for My Chart.   IF you received an x-ray today, you will receive an invoice from [facility]. Please contact [facility] at [phone] with questions or concerns regarding your invoice.   IF you received labwork today, you will receive an invoice from [facility]. Please contact [facility] at [phone] with questions or concerns regarding your invoice.   Our billing staff will not be able to assist you with questions regarding bills from these companies.  You will be contacted with the lab results as soon as they are available. The fastest way to get your results is to activate your My Chart account. Instructions are located on the last page of this paperwork. If you have not heard from [place] regarding the results in  2 weeks, please contact this office.         Signed, Merri Ray, MD [facility]

## 2019-11-01 LAB — COMPREHENSIVE METABOLIC PANEL
ALT: 23 IU/L (ref 0–44)
AST: 23 IU/L (ref 0–40)
Albumin/Globulin Ratio: 2.6 — ABNORMAL HIGH (ref 1.2–2.2)
Albumin: 4.7 g/dL (ref 3.8–4.9)
Alkaline Phosphatase: 89 IU/L (ref 48–121)
BUN/Creatinine Ratio: 13 (ref 9–20)
BUN: 14 mg/dL (ref 6–24)
Bilirubin Total: 0.2 mg/dL (ref 0.0–1.2)
CO2: 24 mmol/L (ref 20–29)
Calcium: 9.4 mg/dL (ref 8.7–10.2)
Chloride: 103 mmol/L (ref 96–106)
Creatinine, Ser: 1.07 mg/dL (ref 0.76–1.27)
GFR calc Af Amer: 88 mL/min/{1.73_m2} (ref >59)
GFR calc non Af Amer: 76 mL/min/{1.73_m2} (ref >59)
Globulin, Total: 1.8 g/dL (ref 1.5–4.5)
Glucose: 101 mg/dL — ABNORMAL HIGH (ref 65–99)
Potassium: 4.2 mmol/L (ref 3.5–5.2)
Sodium: 140 mmol/L (ref 134–144)
Total Protein: 6.5 g/dL (ref 6.0–8.5)

## 2019-11-01 LAB — LIPID PANEL
Chol/HDL Ratio: 4.3 ratio (ref 0.0–5.0)
Cholesterol, Total: 159 mg/dL (ref 100–199)
HDL: 37 mg/dL — ABNORMAL LOW (ref >39)
LDL Chol Calc (NIH): 101 mg/dL — ABNORMAL HIGH (ref 0–99)
Triglycerides: 113 mg/dL (ref 0–149)
VLDL Cholesterol Cal: 21 mg/dL (ref 5–40)

## 2019-12-05 ENCOUNTER — Telehealth: Payer: Self-pay | Admitting: Family Medicine

## 2019-12-05 NOTE — Telephone Encounter (Signed)
[employer] company called for Requesting medical records for 5 years the fax was send to [place] on 12/05/19 pleas Advice

## 2019-12-06 NOTE — Telephone Encounter (Signed)
[employer] called again making sure all our information was correct and they are going to re send the fax to PCP. Please advise

## 2019-12-07 NOTE — Telephone Encounter (Signed)
Noted - fax not verified, but am aware of release

## 2019-12-10 ENCOUNTER — Other Ambulatory Visit: Payer: Self-pay | Admitting: Podiatry

## 2019-12-10 MED ORDER — DICLOFENAC SODIUM 75 MG PO TBEC: 75.0000 mg | DELAYED_RELEASE_TABLET | Freq: Two times a day (BID) | ORAL | 2 refills | Status: DC

## 2019-12-11 NOTE — Telephone Encounter (Signed)
The release from [employer] for pt was faxed to [employer] on 12/10/19.  We got a confirmation.

## 2019-12-20 ENCOUNTER — Other Ambulatory Visit: Payer: Self-pay

## 2019-12-20 ENCOUNTER — Ambulatory Visit: Payer: Federal, State, Local not specified - PPO | Admitting: Family Medicine

## 2019-12-20 VITALS — Wt 229.8 lb

## 2019-12-20 DIAGNOSIS — F40298 Other specified phobia: Secondary | ICD-10-CM

## 2019-12-25 ENCOUNTER — Encounter: Payer: Self-pay | Admitting: Family Medicine

## 2019-12-25 NOTE — Progress Notes (Signed)
Here for weight check only, patient not seen/examined.

## 2020-02-05 DIAGNOSIS — D2372 Other benign neoplasm of skin of left lower limb, including hip: Secondary | ICD-10-CM | POA: Diagnosis not present

## 2020-02-05 DIAGNOSIS — D2272 Melanocytic nevi of left lower limb, including hip: Secondary | ICD-10-CM | POA: Diagnosis not present

## 2020-02-05 DIAGNOSIS — D225 Melanocytic nevi of trunk: Secondary | ICD-10-CM | POA: Diagnosis not present

## 2020-02-05 DIAGNOSIS — D2271 Melanocytic nevi of right lower limb, including hip: Secondary | ICD-10-CM | POA: Diagnosis not present

## 2020-02-23 ENCOUNTER — Other Ambulatory Visit: Payer: Self-pay | Admitting: Podiatry

## 2020-04-25 ENCOUNTER — Other Ambulatory Visit: Payer: Self-pay | Admitting: Family Medicine

## 2020-05-02 ENCOUNTER — Other Ambulatory Visit: Payer: Self-pay | Admitting: Podiatry

## 2020-05-05 NOTE — Telephone Encounter (Signed)
Please advise 

## 2020-05-05 NOTE — Telephone Encounter (Signed)
He can refill for next 2 months

## 2020-06-30 DIAGNOSIS — Z20822 Contact with and (suspected) exposure to covid-19: Secondary | ICD-10-CM | POA: Diagnosis not present

## 2020-07-19 ENCOUNTER — Other Ambulatory Visit: Payer: Self-pay | Admitting: Podiatry

## 2020-07-20 NOTE — Telephone Encounter (Signed)
Please advise 

## 2020-09-29 ENCOUNTER — Ambulatory Visit: Payer: Federal, State, Local not specified - PPO | Admitting: Family Medicine

## 2020-09-29 ENCOUNTER — Other Ambulatory Visit: Payer: Self-pay

## 2020-09-29 ENCOUNTER — Encounter: Payer: Self-pay | Admitting: Family Medicine

## 2020-09-29 VITALS — BP 132/74 | HR 68 | Temp 98.2°F | Resp 16 | Ht 69.0 in | Wt 191.0 lb

## 2020-09-29 DIAGNOSIS — Z23 Encounter for immunization: Secondary | ICD-10-CM

## 2020-09-29 DIAGNOSIS — N481 Balanitis: Secondary | ICD-10-CM | POA: Diagnosis not present

## 2020-09-29 MED ORDER — CLOTRIMAZOLE 1 % EX CREA: 1.0000 "application " | TOPICAL_CREAM | Freq: Two times a day (BID) | CUTANEOUS | 1 refills | Status: DC

## 2020-09-29 NOTE — Progress Notes (Signed)
Subjective:  Patient ID: Drew Miles, male    DOB: 10/26/1960  Age: 60 y.o. MRN: [id number]  CC:  Chief Complaint  Patient presents with  . Groin Swelling    Pt reports swelling in groin area has gone down some, also notes a rash and itching in the area. Started this morning.     HPI Drew Miles presents for   Groin swelling: Woke up this am with some swelling on undersurface of penis just proximal to head of penis. Slight redness. Some improvement during the day.   No penial d/c, no ulcers.  Circumcised. Monogamous with spouse. Intercourse in past few days. No pain or discomfort afterwards. No bleeding in underwear.  Spouse had yeast infection recently at area of c section - that has improved.  No home blood sugar readings.  Has been intentionally losing weight.  Tx: antifungal cream once this morning. otc clotrimazole?   Wt Readings from Last 3 Encounters:  09/29/20 191 lb (86.6 kg)  12/20/19 (!) 229 lb 12.8 oz (104.2 kg)  10/31/19 247 lb (112 kg)    Results for orders placed or performed in visit on 10/31/19  Lipid panel  Result Value Ref Range   Cholesterol, Total 159 100 - 199 mg/dL   Triglycerides 409113 0 - 149 mg/dL   HDL 37 (L) >81>39 mg/dL   VLDL Cholesterol Cal 21 5 - 40 mg/dL   LDL Chol Calc (NIH) 191101 (H) 0 - 99 mg/dL   Chol/HDL Ratio 4.3 0.0 - 5.0 ratio  Comprehensive metabolic panel  Result Value Ref Range   Glucose 101 (H) 65 - 99 mg/dL   BUN 14 6 - 24 mg/dL   Creatinine, Ser 4.781.07 0.76 - 1.27 mg/dL   GFR calc non Af Amer 76 >59 mL/min/1.73   GFR calc Af Amer 88 >59 mL/min/1.73   BUN/Creatinine Ratio 13 9 - 20   Sodium 140 134 - 144 mmol/L   Potassium 4.2 3.5 - 5.2 mmol/L   Chloride 103 96 - 106 mmol/L   CO2 24 20 - 29 mmol/L   Calcium 9.4 8.7 - 10.2 mg/dL   Total Protein 6.5 6.0 - 8.5 g/dL   Albumin 4.7 3.8 - 4.9 g/dL   Globulin, Total 1.8 1.5 - 4.5 g/dL   Albumin/Globulin Ratio 2.6 (H) 1.2 - 2.2   Bilirubin Total 0.2 0.0 - 1.2 mg/dL   Alkaline  Phosphatase 89 48 - 121 IU/L   AST 23 0 - 40 IU/L   ALT 23 0 - 44 IU/L     History Patient Active Problem List   Diagnosis Date Noted  . Lateral epicondylitis of right elbow 02/27/2018  . Adhesive capsulitis of shoulder 02/27/2018  . Shoulder joint pain 02/27/2018  . Obesity, unspecified 02/06/2013  . Hypertension 02/01/2012  . Hyperlipidemia 02/01/2012  . Reflux 02/01/2012  . Abnormal CT scan 02/01/2012   Past Medical History:  Diagnosis Date  . Allergy   . Hyperlipidemia   . Hypertension    Past Surgical History:  Procedure Laterality Date  . SHOULDER SURGERY  2013  . VASECTOMY     Allergies  Allergen Reactions  . Ether Other (See Comments)  . Anesthetics, Ester Other (See Comments)    INCREASED BP   Prior to Admission medications   Medication Sig Start Date End Date Taking? Authorizing Provider  Cholecalciferol (VITAMIN D) 400 UNITS capsule Take 1,000 Units by mouth daily.   Yes [provider]  diclofenac (VOLTAREN) 75 MG EC tablet  TAKE 1 TABLET BY MOUTH TWICE A DAY Patient taking differently: Take 75 mg by mouth daily. 07/21/20  Yes Regal, Kirstie Peri, DPM  fish oil-omega-3 fatty acids 1000 MG capsule Take 2 g by mouth daily.   Yes [provider]  lisinopril (ZESTRIL) 20 MG tablet Take 1 tablet (20 mg total) by mouth daily. 10/31/19  Yes Shade Flood, MD  Multiple Vitamin (MULTIVITAMIN) tablet Take 1 tablet by mouth daily.   Yes [provider]  omeprazole (PRILOSEC) 20 MG capsule TAKE 1 TABLET BY MOUTH DAILY 10/31/19  Yes Shade Flood, MD  rosuvastatin (CRESTOR) 20 MG tablet TAKE 1 TABLET BY MOUTH EVERY DAY 04/25/20  Yes Shade Flood, MD  sildenafil (REVATIO) 20 MG tablet TAKE 1-2 TABS (20-40MG ) A DAY AS NEEDED PRIOR TO SEXUAL ACTIVITY 10/31/19  Yes Shade Flood, MD   Social History   Socioeconomic History  . Marital status: Married    Spouse name: [name]  . Number of children: 3  . Years of education: Bachelor's  .  Highest education level: Not on file  Occupational History  . Occupation: Conservation officer, nature: [employer]  Tobacco Use  . Smoking status: Former Smoker    Years: 5.00    Types: Cigarettes    Quit date: 05/31/1980    Years since quitting: 40.3  . Smokeless tobacco: Never Used  Substance and Sexual Activity  . Alcohol use: No  . Drug use: No  . Sexual activity: Yes  Other Topics Concern  . Not on file  Social History Narrative   Lives with wife   Caffeine use: 3-4 diet cokes per day   Married. Education: [employer]. Exercise: No   Social Determinants of Health   Financial Resource Strain: Not on file  Food Insecurity: Not on file  Transportation Needs: Not on file  Physical Activity: Not on file  Stress: Not on file  Social Connections: Not on file  Intimate Partner Violence: Not on file    Review of Systems   Objective:   Vitals:   09/29/20 1354  BP: 132/74  Pulse: 68  Resp: 16  Temp: 98.2 F (36.8 C)  TempSrc: Temporal  SpO2: 98%  Weight: 191 lb (86.6 kg)  Height: 5\' 9"  (1.753 m)     Physical Exam Constitutional:      General: He is not in acute distress.    Appearance: He is well-developed.  HENT:     Head: Normocephalic and atraumatic.  Cardiovascular:     Rate and Rhythm: Normal rate.  Pulmonary:     Effort: Pulmonary effort is normal.  Abdominal:     Hernia: There is no hernia in the left inguinal area or right inguinal area.  Genitourinary:    Testes:        Right: Mass, tenderness or swelling not present.        Left: Mass, tenderness or swelling not present.     Epididymis:     Right: Normal. No tenderness.     Left: Normal. No tenderness.    Lymphadenopathy:     Lower Body: No right inguinal adenopathy. No left inguinal adenopathy.  Neurological:     Mental Status: He is alert and oriented to person, place, and time.     Assessment & Plan:  Drew Miles is a 60 y.o. male . Balanitis - Plan: clotrimazole (LOTRIMIN) 1 % cream  -  suspected candidal balanitis. Some improvement during day.   -continue topical antifungal,  prn otc hydrocortisone - min use short term if needed.   - rtc precautions if persistent or worsening to eval for other balanits causes vs contact dermatitis  Need for diphtheria-tetanus-pertussis (Tdap) vaccine - Plan: Tdap vaccine greater than or equal to 7yo IM   Meds ordered this encounter  Medications  . clotrimazole (LOTRIMIN) 1 % cream    Sig: Apply 1 application topically 2 (two) times daily.    Dispense:  30 g    Refill:  1   Patient Instructions  Clotrimazole antifungal cream to affected area twice per day for the next 1 week, can extend up to 2 weeks if symptoms have not resolved.  If needed for itching, over-the-counter hydrocortisone cream, small amount only to the affected area, and only if needed.   If worsening symptoms or not improving within the next 1 to 2 weeks at the most, please be seen.   Return to the clinic or go to the nearest emergency room if any of your symptoms worsen or new symptoms occur.    Ferri's Clinical Advisor 2018 (pp. [phone].[phone]). [place], [place]: Elsevier.">  Balanitis  Balanitis is swelling and irritation of the head of the penis (glans penis). Balanitis occurs most often among males who have not had their foreskin removed (uncircumcised). In uncircumcised males, the condition may also cause inflammation of the skin around the foreskin. Balanitis sometimes causes scarring of the penis or foreskin, which can require surgery. This condition may develop because of an infection or another medical condition. Untreated balanitis can increase the risk of penile cancer. What are the causes? Common causes of this condition include:  Poor personal hygiene, especially in uncircumcised males. Not cleaning the glans penis and foreskin well can result in a buildup of bacteria, viruses, and yeast, which can lead to infection and inflammation.  Irritation and lack of  air flow due to fluid (smegma) that can build up on the glans penis. Other causes include:  Chemical irritation from products such as soaps or shower gels, especially those that have fragrance. Chemical irritation can also be caused by condoms, personal lubricants, petroleum jelly, spermicides, or fabric softeners.  Skin conditions, such as eczema, dermatitis, and psoriasis.  Allergies to medicines, such as tetracycline and sulfa drugs. What increases the risk? The following factors may make you more likely to develop this condition:  Being an uncircumcised male.  Having diabetes.  Having other medical conditions, including liver cirrhosis, congestive heart failure, or kidney disease.  Having infections, such as candidiasis, HPV (human papillomavirus), herpes simplex, gonorrhea, or syphilis.  Having a tight foreskin that is difficult to pull back (retract) past the glans penis.  Being severely obese. What are the signs or symptoms? Symptoms of this condition include:  Discharge from under the foreskin, and pain or difficulty retracting the foreskin.  A bad smell or itchiness on the penis.  Tenderness, redness, and swelling of the glans penis.  A rash or sores on the glans penis or foreskin.  Inability to get an erection due to pain.  Difficulty urinating.  Scarring of the penis or foreskin, in some cases. How is this diagnosed? This condition may be diagnosed based on a physical exam and tests of a swab of discharge to check for bacterial or fungal infection. You may also have blood tests to check for:  Viruses that can cause balanitis.  A high blood sugar (glucose) level. This could be a sign of diabetes, which can increase the risk of balanitis. How is this  treated? Treatment for balanitis depends on the cause. Treatment may include:  Improving personal hygiene. Your health care provider may recommend sitting in a bath of warm water that is deep enough to cover your  hips and buttocks (sitz bath).  Taking medicines such as: ? Creams or ointments to reduce swelling (steroids) or to treat an infection. ? Antibiotic medicine. ? Antifungal medicine.  Having surgery to remove or cut the foreskin (circumcision). This may be done if you have scarring on the foreskin that makes it difficult to retract.  Controlling other medical problems that may be causing your condition or making it worse. Follow these instructions at home: Medicines  Take over-the-counter and prescription medicines only as told by your health care provider.  If you were prescribed an antibiotic medicine or a cream or ointment, use it as told by your health care provider. Do not stop using your medicine, cream, or ointment even if you start to feel better. General instructions  Do not have sex until the condition clears up, or until your health care provider approves.  Keep your penis clean and dry. Take sitz baths as recommended by your health care provider.  Avoid products that irritate your skin or make symptoms worse, such as soaps and shower gels that have fragrance.  Keep all follow-up visits as told by your health care provider. This is important. Contact a health care provider if:  Your symptoms get worse or do not improve with home care.  You develop chills or a fever.  You have trouble urinating.  You cannot retract your foreskin. Get help right away if:  You develop severe pain.  You are unable to urinate. Summary  Balanitis is inflammation of the head of the penis (glans penis) caused by irritation or infection. This condition is most common among uncircumcised males.  Balanitis causes pain, redness, and swelling of the glans penis.  Good personal hygiene is important.  Treatment may include improving personal hygiene and applying creams or ointments.  Contact a health care provider if your symptoms get worse or do not improve with home care. This  information is not intended to replace advice given to you by your health care provider. Make sure you discuss any questions you have with your health care provider. Document Revised: 03/07/2019 Document Reviewed: 03/07/2019 Elsevier Patient Education  2021 Elsevier Inc.      Signed, Meredith Staggers, MD [facility]

## 2020-09-29 NOTE — Patient Instructions (Addendum)
Clotrimazole antifungal cream to affected area twice per day for the next 1 week, can extend up to 2 weeks if symptoms have not resolved.  If needed for itching, over-the-counter hydrocortisone cream, small amount only to the affected area, and only if needed.   If worsening symptoms or not improving within the next 1 to 2 weeks at the most, please be seen.   Return to the clinic or go to the nearest emergency room if any of your symptoms worsen or new symptoms occur.    Ferri's Clinical Advisor 2018 (pp. [phone]). [place], [place]: Elsevier.">  Balanitis  Balanitis is swelling and irritation of the head of the penis (glans penis). Balanitis occurs most often among males who have not had their foreskin removed (uncircumcised). In uncircumcised males, the condition may also cause inflammation of the skin around the foreskin. Balanitis sometimes causes scarring of the penis or foreskin, which can require surgery. This condition may develop because of an infection or another medical condition. Untreated balanitis can increase the risk of penile cancer. What are the causes? Common causes of this condition include:  Poor personal hygiene, especially in uncircumcised males. Not cleaning the glans penis and foreskin well can result in a buildup of bacteria, viruses, and yeast, which can lead to infection and inflammation.  Irritation and lack of air flow due to fluid (smegma) that can build up on the glans penis. Other causes include:  Chemical irritation from products such as soaps or shower gels, especially those that have fragrance. Chemical irritation can also be caused by condoms, personal lubricants, petroleum jelly, spermicides, or fabric softeners.  Skin conditions, such as eczema, dermatitis, and psoriasis.  Allergies to medicines, such as tetracycline and sulfa drugs. What increases the risk? The following factors may make you more likely to develop this condition:  Being an  uncircumcised male.  Having diabetes.  Having other medical conditions, including liver cirrhosis, congestive heart failure, or kidney disease.  Having infections, such as candidiasis, HPV (human papillomavirus), herpes simplex, gonorrhea, or syphilis.  Having a tight foreskin that is difficult to pull back (retract) past the glans penis.  Being severely obese. What are the signs or symptoms? Symptoms of this condition include:  Discharge from under the foreskin, and pain or difficulty retracting the foreskin.  A bad smell or itchiness on the penis.  Tenderness, redness, and swelling of the glans penis.  A rash or sores on the glans penis or foreskin.  Inability to get an erection due to pain.  Difficulty urinating.  Scarring of the penis or foreskin, in some cases. How is this diagnosed? This condition may be diagnosed based on a physical exam and tests of a swab of discharge to check for bacterial or fungal infection. You may also have blood tests to check for:  Viruses that can cause balanitis.  A high blood sugar (glucose) level. This could be a sign of diabetes, which can increase the risk of balanitis. How is this treated? Treatment for balanitis depends on the cause. Treatment may include:  Improving personal hygiene. Your health care provider may recommend sitting in a bath of warm water that is deep enough to cover your hips and buttocks (sitz bath).  Taking medicines such as: ? Creams or ointments to reduce swelling (steroids) or to treat an infection. ? Antibiotic medicine. ? Antifungal medicine.  Having surgery to remove or cut the foreskin (circumcision). This may be done if you have scarring on the foreskin that makes it difficult to  retract.  Controlling other medical problems that may be causing your condition or making it worse. Follow these instructions at home: Medicines  Take over-the-counter and prescription medicines only as told by your health  care provider.  If you were prescribed an antibiotic medicine or a cream or ointment, use it as told by your health care provider. Do not stop using your medicine, cream, or ointment even if you start to feel better. General instructions  Do not have sex until the condition clears up, or until your health care provider approves.  Keep your penis clean and dry. Take sitz baths as recommended by your health care provider.  Avoid products that irritate your skin or make symptoms worse, such as soaps and shower gels that have fragrance.  Keep all follow-up visits as told by your health care provider. This is important. Contact a health care provider if:  Your symptoms get worse or do not improve with home care.  You develop chills or a fever.  You have trouble urinating.  You cannot retract your foreskin. Get help right away if:  You develop severe pain.  You are unable to urinate. Summary  Balanitis is inflammation of the head of the penis (glans penis) caused by irritation or infection. This condition is most common among uncircumcised males.  Balanitis causes pain, redness, and swelling of the glans penis.  Good personal hygiene is important.  Treatment may include improving personal hygiene and applying creams or ointments.  Contact a health care provider if your symptoms get worse or do not improve with home care. This information is not intended to replace advice given to you by your health care provider. Make sure you discuss any questions you have with your health care provider. Document Revised: 03/07/2019 Document Reviewed: 03/07/2019 Elsevier Patient Education  2021 [employer].

## 2020-10-02 ENCOUNTER — Encounter: Payer: Self-pay | Admitting: Family Medicine

## 2020-10-02 ENCOUNTER — Other Ambulatory Visit: Payer: Self-pay | Admitting: Family Medicine

## 2020-10-02 ENCOUNTER — Ambulatory Visit: Payer: Federal, State, Local not specified - PPO | Admitting: Family Medicine

## 2020-10-02 ENCOUNTER — Other Ambulatory Visit: Payer: Self-pay

## 2020-10-02 VITALS — BP 128/70 | HR 78 | Temp 98.3°F | Resp 16 | Ht 69.0 in | Wt 191.4 lb

## 2020-10-02 DIAGNOSIS — L259 Unspecified contact dermatitis, unspecified cause: Secondary | ICD-10-CM | POA: Diagnosis not present

## 2020-10-02 DIAGNOSIS — K219 Gastro-esophageal reflux disease without esophagitis: Secondary | ICD-10-CM

## 2020-10-02 DIAGNOSIS — I10 Essential (primary) hypertension: Secondary | ICD-10-CM

## 2020-10-02 DIAGNOSIS — N529 Male erectile dysfunction, unspecified: Secondary | ICD-10-CM

## 2020-10-02 DIAGNOSIS — R21 Rash and other nonspecific skin eruption: Secondary | ICD-10-CM

## 2020-10-02 MED ORDER — PERMETHRIN 5 % EX CREA: 1.0000 "application " | TOPICAL_CREAM | Freq: Once | CUTANEOUS | 0 refills | Status: AC

## 2020-10-02 MED ORDER — PREDNISONE 20 MG PO TABS: ORAL_TABLET | ORAL | 0 refills | Status: DC

## 2020-10-02 NOTE — Patient Instructions (Signed)
With new rash, unlikely fungal infection in the groin.  I am suspicious of poison ivy or contact dermatitis from some plant versus possible scabies as we discussed.  Start prednisone, then if not improving in the next few days (or spouse experiences the same rash) then the likelihood of scabies increases and would recommend treating with Elimite cream as we discussed.  Okay to apply cortisone to itching areas a few times per day if needed.  Let me know how things are going next week but follow-up if worsening.

## 2020-10-02 NOTE — Progress Notes (Signed)
Subjective:  Patient ID: Drew Miles, male    DOB: 06-23-1960  Age: 60 y.o. MRN: [id number]  CC:  Chief Complaint  Patient presents with  . Rash    Pt here 5/2 for rash pt now has rash on neck, hands, and arm. Pt has been using Rxd ointment. Pt notes new areas are itchy, red, some small bumps present within rash. Original rash is feeling better as well as apears better    HPI Drew Miles presents for   Rash: Follow-up from office visit 3 days ago, had some irritation at the distal penile shaft, base of the penis at that time, slight swelling, redness that morning that had somewhat improved by time of his visit.  Wife with recent fungal infection, thought to be candidal balanitis.  Had self treated with topical antifungal with some improvement that morning, continued on clotrimazole twice daily topical with hydrocortisone if needed for irritation.  Since last visit now has had a rash appear on neck, hands and arm.  He has applied the antifungal to these areas as well.  New areas do have some pruritus, redness with small bumps.  Initial rash in the groin/penile symptoms have improved. Still some itching, but better and swelling has improved. No new genital/groin rash. Has used cortisone on groin once, few times to fingers.   Rash started between fingers on both hands, worse on R side. Similar rash on top of hand- some clear fluid form blister on finger, neck.  Also on left neck, upper chest. No contact with poison ivy.  No sick contacts with rash.  Sleeps in same bed with spouse, but she has not had rash. Used weed eater and mowed lawn last weekend - few days before rash started.       History Patient Active Problem List   Diagnosis Date Noted  . Lateral epicondylitis of right elbow 02/27/2018  . Adhesive capsulitis of shoulder 02/27/2018  . Shoulder joint pain 02/27/2018  . Obesity, unspecified 02/06/2013  . Hypertension 02/01/2012  . Hyperlipidemia 02/01/2012  . Reflux  02/01/2012  . Abnormal CT scan 02/01/2012   Past Medical History:  Diagnosis Date  . Allergy   . Hyperlipidemia   . Hypertension    Past Surgical History:  Procedure Laterality Date  . SHOULDER SURGERY  2013  . VASECTOMY     Allergies  Allergen Reactions  . Ether Other (See Comments)  . Anesthetics, Ester Other (See Comments)    INCREASED BP   Prior to Admission medications   Medication Sig Start Date End Date Taking? Authorizing Provider  Cholecalciferol (VITAMIN D) 400 UNITS capsule Take 1,000 Units by mouth daily.    [provider]  clotrimazole (LOTRIMIN) 1 % cream Apply 1 application topically 2 (two) times daily. 09/29/20   Shade Flood, MD  diclofenac (VOLTAREN) 75 MG EC tablet TAKE 1 TABLET BY MOUTH TWICE A DAY Patient taking differently: Take 75 mg by mouth daily. 07/21/20   Lenn Sink, DPM  fish oil-omega-3 fatty acids 1000 MG capsule Take 2 g by mouth daily.    [provider]  lisinopril (ZESTRIL) 20 MG tablet Take 1 tablet (20 mg total) by mouth daily. 10/31/19   Shade Flood, MD  Multiple Vitamin (MULTIVITAMIN) tablet Take 1 tablet by mouth daily.    [provider]  omeprazole (PRILOSEC) 20 MG capsule TAKE 1 TABLET BY MOUTH DAILY 10/31/19   Shade Flood, MD  rosuvastatin (CRESTOR) 20 MG tablet TAKE  1 TABLET BY MOUTH EVERY DAY 04/25/20   Shade Flood, MD  sildenafil (REVATIO) 20 MG tablet TAKE 1-2 TABS (20-40MG ) A DAY AS NEEDED PRIOR TO SEXUAL ACTIVITY 10/31/19   Shade Flood, MD   Social History   Socioeconomic History  . Marital status: Married    Spouse name: [name]  . Number of children: 3  . Years of education: Bachelor's  . Highest education level: Not on file  Occupational History  . Occupation: Conservation officer, nature: [employer]  Tobacco Use  . Smoking status: Former Smoker    Years: 5.00    Types: Cigarettes    Quit date: 05/31/1980    Years since quitting: 40.3  . Smokeless tobacco: Never Used   Substance and Sexual Activity  . Alcohol use: No  . Drug use: No  . Sexual activity: Yes  Other Topics Concern  . Not on file  Social History Narrative   Lives with wife   Caffeine use: 3-4 diet cokes per day   Married. Education: [employer]. Exercise: No   Social Determinants of Health   Financial Resource Strain: Not on file  Food Insecurity: Not on file  Transportation Needs: Not on file  Physical Activity: Not on file  Stress: Not on file  Social Connections: Not on file  Intimate Partner Violence: Not on file    Review of Systems Per HPI.   Objective:   Vitals:   10/02/20 1647  BP: 128/70  Pulse: 78  Resp: 16  Temp: 98.3 F (36.8 C)  TempSrc: Temporal  SpO2: 97%  Weight: 191 lb 6.4 oz (86.8 kg)  Height: 5\' 9"  (1.753 m)    Physical Exam Constitutional:      General: He is not in acute distress.    Appearance: He is well-developed.  HENT:     Head: Normocephalic and atraumatic.  Cardiovascular:     Rate and Rhythm: Normal rate.  Pulmonary:     Effort: Pulmonary effort is normal.  Skin:    General: Skin is warm and dry.     Findings: Rash present.     Comments: R hand - possible burrow at dorsal hand, interdigital, with small papules, vesicles.  Erythematous patches left neck, upper chest. See photos.   Neurological:     Mental Status: He is alert and oriented to person, place, and time.          Assessment & Plan:  Drew Miles is a 60 y.o. male . Contact dermatitis, unspecified contact dermatitis type, unspecified trigger - Plan: predniSONE (DELTASONE) 20 MG tablet  Rash and nonspecific skin eruption - Plan: predniSONE (DELTASONE) 20 MG tablet New areas of rash initially suspicious for scabies although less common in the neck.  Did initially start in the genital area.  Differential includes contact dermatitis with poison ivy/oak/sumac as he was doing yard work few days before start of symptoms.  Questionable burrow on hand, no definitive  confluence of vesicles.  -Will initially treat for contact dermatitis/poison ivy with prednisone, potential side effects and risk discussed.  Has taken in the past without difficulty.  -If not improving or worsening in next few days then increased likelihood of scabies and will start Elimite - printed rx. rtc precautions.   Meds ordered this encounter  Medications  . predniSONE (DELTASONE) 20 MG tablet    Sig: 3 by mouth for 3 days, then 2 by mouth for 2 days, then 1 by mouth for 2 days, then 1/2 by  mouth for 2 days.    Dispense:  16 tablet    Refill:  0  . permethrin (ELIMITE) 5 % cream    Sig: Apply 1 application topically once for 1 dose. Rinse in 8 hours. Avoid eyes,nose, mouth.    Dispense:  60 g    Refill:  0   Patient Instructions  With new rash, unlikely fungal infection in the groin.  I am suspicious of poison ivy or contact dermatitis from some plant versus possible scabies as we discussed.  Start prednisone, then if not improving in the next few days (or spouse experiences the same rash) then the likelihood of scabies increases and would recommend treating with Elimite cream as we discussed.  Okay to apply cortisone to itching areas a few times per day if needed.  Let me know how things are going next week but follow-up if worsening.       Signed, Meredith Staggers, MD [facility]

## 2020-10-07 ENCOUNTER — Other Ambulatory Visit: Payer: Self-pay | Admitting: Podiatry

## 2020-10-07 ENCOUNTER — Other Ambulatory Visit: Payer: Self-pay | Admitting: Family Medicine

## 2020-10-07 DIAGNOSIS — I10 Essential (primary) hypertension: Secondary | ICD-10-CM

## 2020-10-07 DIAGNOSIS — K219 Gastro-esophageal reflux disease without esophagitis: Secondary | ICD-10-CM

## 2020-10-07 MED ORDER — OMEPRAZOLE 20 MG PO CPDR: DELAYED_RELEASE_CAPSULE | ORAL | 0 refills | Status: DC

## 2020-10-07 MED ORDER — ROSUVASTATIN CALCIUM 20 MG PO TABS: 20.0000 mg | ORAL_TABLET | Freq: Every day | ORAL | 0 refills | Status: DC

## 2020-10-07 MED ORDER — LISINOPRIL 20 MG PO TABS: 20.0000 mg | ORAL_TABLET | Freq: Every day | ORAL | 0 refills | Status: DC

## 2020-10-07 MED ORDER — SILDENAFIL CITRATE 20 MG PO TABS: ORAL_TABLET | ORAL | 2 refills | Status: DC

## 2021-01-26 ENCOUNTER — Encounter: Payer: Self-pay | Admitting: Family Medicine

## 2021-01-26 ENCOUNTER — Ambulatory Visit: Payer: Federal, State, Local not specified - PPO | Admitting: Family Medicine

## 2021-01-26 ENCOUNTER — Other Ambulatory Visit: Payer: Self-pay

## 2021-01-26 VITALS — BP 126/68 | HR 68 | Temp 98.2°F | Resp 16 | Ht 69.0 in | Wt 188.6 lb

## 2021-01-26 DIAGNOSIS — Z23 Encounter for immunization: Secondary | ICD-10-CM

## 2021-01-26 DIAGNOSIS — K219 Gastro-esophageal reflux disease without esophagitis: Secondary | ICD-10-CM | POA: Diagnosis not present

## 2021-01-26 DIAGNOSIS — N529 Male erectile dysfunction, unspecified: Secondary | ICD-10-CM

## 2021-01-26 DIAGNOSIS — E785 Hyperlipidemia, unspecified: Secondary | ICD-10-CM

## 2021-01-26 DIAGNOSIS — I10 Essential (primary) hypertension: Secondary | ICD-10-CM | POA: Diagnosis not present

## 2021-01-26 DIAGNOSIS — Z8249 Family history of ischemic heart disease and other diseases of the circulatory system: Secondary | ICD-10-CM

## 2021-01-26 MED ORDER — ROSUVASTATIN CALCIUM 20 MG PO TABS: 20.0000 mg | ORAL_TABLET | Freq: Every day | ORAL | 1 refills | Status: DC

## 2021-01-26 MED ORDER — OMEPRAZOLE 20 MG PO CPDR: DELAYED_RELEASE_CAPSULE | ORAL | 1 refills | Status: DC

## 2021-01-26 MED ORDER — LISINOPRIL 20 MG PO TABS: 20.0000 mg | ORAL_TABLET | Freq: Every day | ORAL | 1 refills | Status: DC

## 2021-01-26 MED ORDER — SILDENAFIL CITRATE 20 MG PO TABS: ORAL_TABLET | ORAL | 2 refills | Status: DC

## 2021-01-26 NOTE — Patient Instructions (Signed)
No change in medications today.  Prevnar 20 pneumonia vaccine was given.  I will refer you to cardiology to discuss your brothers heart history and decide if any testing or monitoring is required for you. If any concerns on labs I will let you know.  Thanks for coming in today.

## 2021-01-26 NOTE — Progress Notes (Signed)
Subjective:  Patient ID: Drew Miles, male    DOB: 01/15/1961  Age: 60 y.o. MRN: [id number]  CC:  Chief Complaint  Patient presents with   Hyperlipidemia    Pt here for recheck no concerns today   Hypertension    Pt doing okay no concerns at this time    Immunizations    Pt agreeable to pneumonia vaccine today if advised     HPI Drew Miles presents for   Hypertension: Lisinopril 20 mg daily. Home readings: none usually.  No new med side effects.  Brother with thickening of heart - hypertrophic cardiomyopathy with apical expression.  BP Readings from Last 3 Encounters:  01/26/21 126/68  10/02/20 128/70  09/29/20 132/74   Lab Results  Component Value Date   CREATININE 1.07 10/31/2019   Hyperlipidemia: Crestor 20 mg daily. No new myalgias.  Lab Results  Component Value Date   CHOL 159 10/31/2019   HDL 37 (L) 10/31/2019   LDLCALC 101 (H) 10/31/2019   TRIG 113 10/31/2019   CHOLHDL 4.3 10/31/2019   Lab Results  Component Value Date   ALT 23 10/31/2019   AST 23 10/31/2019   ALKPHOS 89 10/31/2019   BILITOT 0.2 10/31/2019   GERD: Doing well with daly dosing of PPI- tried intermittent dosing and sx's returned - daily dose now.   Erectile dysfunction: Sildenafil working well at low dose - 1/2- 1 pill of 20mg  No vision/hearing changes.   Prior rash resolved with prednisone.    Health maintenance Plan for Prevnar 20 today. Had pneumonia over 20 years.  Last covid booster in April. Considering new booster in October.    History Patient Active Problem List   Diagnosis Date Noted   Lateral epicondylitis of right elbow 02/27/2018   Adhesive capsulitis of shoulder 02/27/2018   Shoulder joint pain 02/27/2018   Obesity, unspecified 02/06/2013   Hypertension 02/01/2012   Hyperlipidemia 02/01/2012   Reflux 02/01/2012   Abnormal CT scan 02/01/2012   Past Medical History:  Diagnosis Date   Allergy    Hyperlipidemia    Hypertension    Past Surgical History:   Procedure Laterality Date   SHOULDER SURGERY  2013   VASECTOMY     Allergies  Allergen Reactions   Ether Other (See Comments)   Anesthetics, Ester Other (See Comments)    INCREASED BP   Prior to Admission medications   Medication Sig Start Date End Date Taking? Authorizing Provider  Cholecalciferol (VITAMIN D) 400 UNITS capsule Take 1,000 Units by mouth daily.   Yes [provider]  clotrimazole (LOTRIMIN) 1 % cream Apply 1 application topically 2 (two) times daily. 09/29/20  Yes 11/29/20, MD  diclofenac (VOLTAREN) 75 MG EC tablet TAKE 1 TABLET BY MOUTH TWICE A DAY Patient taking differently: Take 75 mg by mouth daily. 07/21/20  Yes Regal, 07/23/20, DPM  fish oil-omega-3 fatty acids 1000 MG capsule Take 2 g by mouth daily.   Yes [provider]  lisinopril (ZESTRIL) 20 MG tablet Take 1 tablet (20 mg total) by mouth daily. 10/07/20  Yes 12/07/20, MD  Multiple Vitamin (MULTIVITAMIN) tablet Take 1 tablet by mouth daily.   Yes [provider]  omeprazole (PRILOSEC) 20 MG capsule TAKE 1 TABLET BY MOUTH DAILY 10/07/20  Yes 12/07/20, MD  predniSONE (DELTASONE) 20 MG tablet 3 by mouth for 3 days, then 2 by mouth for 2 days, then 1 by mouth for 2 days, then 1/2 by  mouth for 2 days. 10/02/20  Yes Shade Flood, MD  rosuvastatin (CRESTOR) 20 MG tablet Take 1 tablet (20 mg total) by mouth daily. 10/07/20  Yes Shade Flood, MD  sildenafil (REVATIO) 20 MG tablet TAKE 1-2 TABS (20-40MG ) A DAY AS NEEDED PRIOR TO SEXUAL ACTIVITY 10/07/20  Yes Shade Flood, MD   Social History   Socioeconomic History   Marital status: Married    Spouse name: Belinda   Number of children: 3   Years of education: Bachelor's   Highest education level: Not on file  Occupational History   Occupation: Conservation officer, nature: [employer]  Tobacco Use   Smoking status: Former    Years: 5.00    Types: Cigarettes    Quit date: 05/31/1980    Years since quitting: 40.6    Smokeless tobacco: Never  Substance and Sexual Activity   Alcohol use: No   Drug use: No   Sexual activity: Yes  Other Topics Concern   Not on file  Social History Narrative   Lives with wife   Caffeine use: 3-4 diet cokes per day   Married. Education: [employer]. Exercise: No   Social Determinants of Health   Financial Resource Strain: Not on file  Food Insecurity: Not on file  Transportation Needs: Not on file  Physical Activity: Not on file  Stress: Not on file  Social Connections: Not on file  Intimate Partner Violence: Not on file    Review of Systems  Constitutional:  Negative for fatigue and unexpected weight change.  Eyes:  Negative for visual disturbance.  Respiratory:  Negative for cough, chest tightness and shortness of breath.   Cardiovascular:  Negative for chest pain, palpitations and leg swelling.  Gastrointestinal:  Negative for abdominal pain and blood in stool.  Neurological:  Negative for dizziness, light-headedness and headaches.    Objective:   Vitals:   01/26/21 1537  BP: 126/68  Pulse: 68  Resp: 16  Temp: 98.2 F (36.8 C)  TempSrc: Temporal  SpO2: 96%  Weight: 188 lb 9.6 oz (85.5 kg)  Height: 5\' 9"  (1.753 m)     Physical Exam Vitals reviewed.  Constitutional:      Appearance: He is well-developed.  HENT:     Head: Normocephalic and atraumatic.  Neck:     Vascular: No carotid bruit or JVD.  Cardiovascular:     Rate and Rhythm: Normal rate and regular rhythm.     Heart sounds: Normal heart sounds. No murmur heard. Pulmonary:     Effort: Pulmonary effort is normal.     Breath sounds: Normal breath sounds. No rales.  Musculoskeletal:     Right lower leg: No edema.     Left lower leg: No edema.  Skin:    General: Skin is warm and dry.  Neurological:     Mental Status: He is alert and oriented to person, place, and time.  Psychiatric:        Mood and Affect: Mood normal.       Assessment & Plan:  Drew Miles is a 60 y.o.  male . Hyperlipidemia, unspecified hyperlipidemia type - Plan: Comprehensive metabolic panel, Lipid panel  -  Stable, tolerating current regimen.. Labs pending as above.   Gastroesophageal reflux disease, unspecified whether esophagitis present - Plan: omeprazole (PRILOSEC) 20 MG capsule  - stable on daily dosing of PPI - continue same.   Essential hypertension - Plan: lisinopril (ZESTRIL) 20 MG tablet, Ambulatory referral to Cardiology  -  Stable, tolerating current regimen. Medications refilled. Labs pending as above.   Hypertension, unspecified type - Plan: Comprehensive metabolic panel  -  Stable, tolerating current regimen.  Labs pending as above.   Erectile dysfunction, unspecified erectile dysfunction type - Plan: sildenafil (REVATIO) 20 MG tablet  - stable with low dose sildenafil - continue same.   Need for vaccination against Streptococcus pneumoniae - Plan: Pneumococcal conjugate vaccine 20-valent (Prevnar 20)  Family history of hypertrophic cardiomyopathy - Plan: Ambulatory referral to Cardiology  - asymptomatic. Refer to cardiology to decide on imaging/further workup given brother's HCM history.   Meds ordered this encounter  Medications   omeprazole (PRILOSEC) 20 MG capsule    Sig: TAKE 1 TABLET BY MOUTH DAILY    Dispense:  90 capsule    Refill:  1   lisinopril (ZESTRIL) 20 MG tablet    Sig: Take 1 tablet (20 mg total) by mouth daily.    Dispense:  90 tablet    Refill:  1   rosuvastatin (CRESTOR) 20 MG tablet    Sig: Take 1 tablet (20 mg total) by mouth daily.    Dispense:  90 tablet    Refill:  1   sildenafil (REVATIO) 20 MG tablet    Sig: TAKE 1-2 TABS (20-40MG ) A DAY AS NEEDED PRIOR TO SEXUAL ACTIVITY    Dispense:  10 tablet    Refill:  2    Please consider 90 day supplies to promote better adherence   Patient Instructions  No change in medications today.  Prevnar 20 pneumonia vaccine was given.  I will refer you to cardiology to discuss your brothers heart  history and decide if any testing or monitoring is required for you. If any concerns on labs I will let you know.  Thanks for coming in today.     Signed,   Meredith Staggers, MD [facility] 01/26/21 10:34 PM

## 2021-01-27 LAB — COMPREHENSIVE METABOLIC PANEL
ALT: 19 U/L (ref 0–53)
AST: 18 U/L (ref 0–37)
Albumin: 4.4 g/dL (ref 3.5–5.2)
Alkaline Phosphatase: 55 U/L (ref 39–117)
BUN: 13 mg/dL (ref 6–23)
CO2: 27 mEq/L (ref 19–32)
Calcium: 9.4 mg/dL (ref 8.4–10.5)
Chloride: 100 mEq/L (ref 96–112)
Creatinine, Ser: 0.92 mg/dL (ref 0.40–1.50)
GFR: 91.2 mL/min (ref >60.00)
Glucose, Bld: 79 mg/dL (ref 70–99)
Potassium: 3.7 mEq/L (ref 3.5–5.1)
Sodium: 136 mEq/L (ref 135–145)
Total Bilirubin: 0.5 mg/dL (ref 0.2–1.2)
Total Protein: 6.3 g/dL (ref 6.0–8.3)

## 2021-01-27 LAB — LIPID PANEL
Cholesterol: 153 mg/dL (ref 0–200)
HDL: 39.4 mg/dL (ref >39.00)
LDL Cholesterol: 91 mg/dL (ref 0–99)
NonHDL: 113.87
Total CHOL/HDL Ratio: 4
Triglycerides: 114 mg/dL (ref 0.0–149.0)
VLDL: 22.8 mg/dL (ref 0.0–40.0)

## 2021-01-28 ENCOUNTER — Telehealth: Payer: Self-pay

## 2021-01-28 NOTE — Telephone Encounter (Signed)
PA submitted 4:46pm for Sildenafil Rx. Via CoverMyMeds.

## 2021-01-29 ENCOUNTER — Telehealth: Payer: Self-pay

## 2021-01-29 ENCOUNTER — Encounter: Payer: Self-pay | Admitting: Family Medicine

## 2021-01-29 ENCOUNTER — Telehealth (INDEPENDENT_AMBULATORY_CARE_PROVIDER_SITE_OTHER): Payer: Federal, State, Local not specified - PPO | Admitting: Family Medicine

## 2021-01-29 VITALS — Temp 99.9°F

## 2021-01-29 DIAGNOSIS — U071 COVID-19: Secondary | ICD-10-CM

## 2021-01-29 MED ORDER — BENZONATATE 100 MG PO CAPS: 100.0000 mg | ORAL_CAPSULE | Freq: Two times a day (BID) | ORAL | 0 refills | Status: DC | PRN

## 2021-01-29 MED ORDER — MOLNUPIRAVIR EUA 200MG CAPSULE: 4.0000 | ORAL_CAPSULE | Freq: Two times a day (BID) | ORAL | 0 refills | Status: AC

## 2021-01-29 NOTE — Progress Notes (Signed)
Virtual Visit via Video Note  I connected with Drew Miles on 01/29/21 at  2:00 PM EDT by a video enabled telemedicine application 2/2 COVID-19 pandemic and verified that I am speaking with the correct person using two identifiers.  Location patient: home Location provider:work or home office Persons participating in the virtual visit: patient, provider  I discussed the limitations of evaluation and management by telemedicine and the availability of in person appointments. The patient expressed understanding and agreed to proceed.   HPI: Pt tested positive for COVID  Symptoms started a few days ago Tuesday 9/30.  Cough, sore throat, HA, fever 100.6 F, Clogged L ear, rhinorrhea, sinus pressure. Denies N/v, diarrhea Tried Goody's powder, Tylenol, Robitussin Sick contacts include patient's wife who tested positive for COVID prior to pt becoming sick. Pt patient states his PCP said he could get antiviral med if he became sick.  Pt had a pneumonia vaccine on Monday.  Pt is full vaccinate and boosted x2.  ROS: See pertinent positives and negatives per HPI.  Past Medical History:  Diagnosis Date   Allergy    Hyperlipidemia    Hypertension     Past Surgical History:  Procedure Laterality Date   SHOULDER SURGERY  2013   VASECTOMY      Family History  Problem Relation Age of Onset   Diverticulitis Mother    Other Mother        Brain tumor   Dementia Father    Epilepsy Son    Hyperlipidemia Brother    Mental illness Brother     Current Outpatient Medications:    Cholecalciferol (VITAMIN D) 400 UNITS capsule, Take 1,000 Units by mouth daily., Disp: , Rfl:    clotrimazole (LOTRIMIN) 1 % cream, Apply 1 application topically 2 (two) times daily., Disp: 30 g, Rfl: 1   diclofenac (VOLTAREN) 75 MG EC tablet, TAKE 1 TABLET BY MOUTH TWICE A DAY (Patient taking differently: Take 75 mg by mouth daily.), Disp: 50 tablet, Rfl: 2   fish oil-omega-3 fatty acids 1000 MG capsule, Take 2 g by  mouth daily., Disp: , Rfl:    lisinopril (ZESTRIL) 20 MG tablet, Take 1 tablet (20 mg total) by mouth daily., Disp: 90 tablet, Rfl: 1   Multiple Vitamin (MULTIVITAMIN) tablet, Take 1 tablet by mouth daily., Disp: , Rfl:    omeprazole (PRILOSEC) 20 MG capsule, TAKE 1 TABLET BY MOUTH DAILY, Disp: 90 capsule, Rfl: 1   predniSONE (DELTASONE) 20 MG tablet, 3 by mouth for 3 days, then 2 by mouth for 2 days, then 1 by mouth for 2 days, then 1/2 by mouth for 2 days., Disp: 16 tablet, Rfl: 0   rosuvastatin (CRESTOR) 20 MG tablet, Take 1 tablet (20 mg total) by mouth daily., Disp: 90 tablet, Rfl: 1   sildenafil (REVATIO) 20 MG tablet, TAKE 1-2 TABS (20-40MG ) A DAY AS NEEDED PRIOR TO SEXUAL ACTIVITY, Disp: 10 tablet, Rfl: 2  EXAM:  VITALS per patient if applicable:  RR between 12-20 bpm  GENERAL: alert, oriented, appears well and in no acute distress  HEENT: atraumatic, conjunctiva clear, no obvious abnormalities on inspection of external nose and ears  NECK: normal movements of the head and neck  LUNGS: on inspection no signs of respiratory distress, breathing rate appears normal, no obvious gross SOB, gasping or wheezing  CV: no obvious cyanosis  MS: moves all visible extremities without noticeable abnormality  PSYCH/NEURO: pleasant and cooperative, no obvious depression or anxiety, speech and thought processing grossly intact  ASSESSMENT AND PLAN:  Discussed the following assessment and plan:  COVID-19 virus infection  -mild symptoms.   -positive COVID test 8/31 -continue supportive care -discussed r/b/a of antivirals - Plan: molnupiravir EUA 200 mg CAPS, benzonatate (TESSALON) 100 MG capsule  F/u prn with pcp.  I discussed the assessment and treatment plan with the patient. The patient was provided an opportunity to ask questions and all were answered. The patient agreed with the plan and demonstrated an understanding of the instructions.   The patient was advised to call back or  seek an in-person evaluation if the symptoms worsen or if the condition fails to improve as anticipated.  Deeann Saint, MD

## 2021-01-29 NOTE — Telephone Encounter (Signed)
Error

## 2021-02-06 ENCOUNTER — Ambulatory Visit: Payer: Federal, State, Local not specified - PPO | Admitting: Cardiology

## 2021-02-09 DIAGNOSIS — N486 Induration penis plastica: Secondary | ICD-10-CM | POA: Diagnosis not present

## 2021-02-09 DIAGNOSIS — R311 Benign essential microscopic hematuria: Secondary | ICD-10-CM | POA: Diagnosis not present

## 2021-02-11 ENCOUNTER — Encounter: Payer: Self-pay | Admitting: Cardiology

## 2021-02-11 ENCOUNTER — Other Ambulatory Visit: Payer: Self-pay

## 2021-02-11 ENCOUNTER — Ambulatory Visit: Payer: Federal, State, Local not specified - PPO | Admitting: Cardiology

## 2021-02-11 VITALS — BP 141/84 | HR 81 | Temp 98.4°F | Resp 16 | Ht 69.0 in | Wt 187.8 lb

## 2021-02-11 DIAGNOSIS — I10 Essential (primary) hypertension: Secondary | ICD-10-CM

## 2021-02-11 DIAGNOSIS — E782 Mixed hyperlipidemia: Secondary | ICD-10-CM | POA: Diagnosis not present

## 2021-02-11 DIAGNOSIS — Z8249 Family history of ischemic heart disease and other diseases of the circulatory system: Secondary | ICD-10-CM

## 2021-02-11 NOTE — Progress Notes (Signed)
Date:  02/11/2021   ID:  Delories Heinz, DOB [birth year], MRN [id number]  PCP:  Shade Flood, MD  Cardiologist:  Tessa Lerner, DO, [facility] (established care 02/11/21 )  REASON FOR CONSULT: Benign essential hypertension and screening for hypertrophic cardiomyopathy  REQUESTING PHYSICIAN:  Shade Flood, MD [address] [place],  [place] [address]  Chief Complaint  Patient presents with   Essential hypertension   Family history of hypertrophic cardiomyopathy   New Patient (Initial Visit)    HPI  Drew Miles is a 60 y.o. male who presents to the office with a chief complaint of " brother recently diagnosed with hypertrophic cardiomyopathy." Patient's past medical history and cardiovascular risk factors include: Hypertension, hyperlipidemia erectile dysfunction, GERD, family history of apical hypertrophy (brother).   He is referred to the office at the request of Shade Flood, MD for evaluation of hypertension and screening for hypertrophic cardiomyopathy.  Very pleasant 60 year old gentleman who works for the [employer] comes to the office for evaluation/screening for hypertrophic cardiomyopathy.  Patient states that including him he has 4 brothers and the second oldest brother was recently diagnosed with HCM known to have the apical variant.  No gene testing was performed.  And it was recommended that his first-degree relatives be screened for hypertrophic cardiomyopathy.  I do not know based on the records available if his cardiomyopathy is obstructive or nonobstructive at this time.  Will obtain additional information if possible.  Clinically patient does not have any chest pain or shortness of breath at rest or with effort related activities.  No recent episodes of near syncope or syncopal events.  Patient is parents to the best of his knowledge are not diagnosed with HCM. No family history of sudden cardiac death.  FUNCTIONAL STATUS: No structured exercise program or daily  routine.  But during the spring months he enjoys hiking.  ALLERGIES: Allergies  Allergen Reactions   Ether Other (See Comments)   Anesthetics, Ester Other (See Comments)    INCREASED BP    MEDICATION LIST PRIOR TO VISIT: Current Meds  Medication Sig   benzonatate (TESSALON) 100 MG capsule Take 1 capsule (100 mg total) by mouth 2 (two) times daily as needed for cough.   Cholecalciferol (VITAMIN D) 400 UNITS capsule Take 1,000 Units by mouth daily.   clotrimazole (LOTRIMIN) 1 % cream Apply 1 application topically 2 (two) times daily.   fish oil-omega-3 fatty acids 1000 MG capsule Take 2 g by mouth daily.   lisinopril (ZESTRIL) 20 MG tablet Take 1 tablet (20 mg total) by mouth daily.   Multiple Vitamin (MULTIVITAMIN) tablet Take 1 tablet by mouth daily.   omeprazole (PRILOSEC) 20 MG capsule TAKE 1 TABLET BY MOUTH DAILY   rosuvastatin (CRESTOR) 20 MG tablet Take 1 tablet (20 mg total) by mouth daily.     PAST MEDICAL HISTORY: Past Medical History:  Diagnosis Date   Allergy    Hyperlipidemia    Hypertension     PAST SURGICAL HISTORY: Past Surgical History:  Procedure Laterality Date   SHOULDER SURGERY  2013   VASECTOMY      FAMILY HISTORY: The patient family history includes Dementia in his father; Diverticulitis in his mother; Epilepsy in his son; Hyperlipidemia in his brother; Mental illness in his brother; Other in his mother.  SOCIAL HISTORY:  The patient  reports that he quit smoking about 40 years ago. His smoking use included cigarettes. He has never used smokeless tobacco. He reports  that he does not drink alcohol and does not use drugs.  REVIEW OF SYSTEMS: Review of Systems  Constitutional: Negative for chills and fever.  HENT:  Negative for hoarse voice and nosebleeds.   Eyes:  Negative for discharge, double vision and pain.  Cardiovascular:  Negative for chest pain, claudication, dyspnea on exertion, leg swelling, near-syncope, orthopnea, palpitations,  paroxysmal nocturnal dyspnea and syncope.  Respiratory:  Negative for hemoptysis and shortness of breath.   Musculoskeletal:  Negative for muscle cramps and myalgias.  Gastrointestinal:  Negative for abdominal pain, constipation, diarrhea, hematemesis, hematochezia, melena, nausea and vomiting.  Neurological:  Negative for dizziness and light-headedness.   PHYSICAL EXAM: Vitals with BMI 02/11/2021 01/26/2021 10/02/2020  Height 5\' 9"  5\' 9"  5\' 9"   Weight 187 lbs 13 oz 188 lbs 10 oz 191 lbs 6 oz  BMI 27.72 27.84 28.25  Systolic 141 126  Diastolic 84 68 70  Pulse 81 68 78    CONSTITUTIONAL: Well-developed and well-nourished. No acute distress.  SKIN: Skin is warm and dry. No rash noted. No cyanosis. No pallor. No jaundice HEAD: Normocephalic and atraumatic.  EYES: No scleral icterus MOUTH/THROAT: Moist oral membranes.  NECK: No JVD present. No thyromegaly noted. No carotid bruits  LYMPHATIC: No visible cervical adenopathy.  CHEST Normal respiratory effort. No intercostal retractions  LUNGS: Clear to auscultation bilaterally. No stridor. No wheezes. No rales.  CARDIOVASCULAR: Regular rate and rhythm, positive S1-S2, no murmurs rubs or gallops appreciated. ABDOMINAL: No apparent ascites.  EXTREMITIES: No peripheral edema  HEMATOLOGIC: No significant bruising NEUROLOGIC: Oriented to person, place, and time. Nonfocal. Normal muscle tone.  PSYCHIATRIC: Normal mood and affect. Normal behavior. Cooperative  CARDIAC DATABASE: EKG: 02/11/2021: Normal sinus rhythm, 66 bpm, normal axis, without underlying ischemia or injury pattern.  Echocardiogram: No results found for this or any previous visit from the past 1095 days.  Stress Testing: No results found for this or any previous visit from the past 1095 days.  Heart Catheterization: None  LABORATORY DATA: CBC Latest Ref Rng & Units 11/10/2017 09/16/2016 10/31/2014  WBC 3.4 - 10.8 x10E3/uL 7.0 6.3 7.1  Hemoglobin 13.0 - 17.7 g/dL 11/12/2017 09/18/2016  12/31/2014  Hematocrit 37.5 - 51.0 % 43.1 43.4 41.3  Platelets 150 - 450 x10E3/uL 190 196 207    CMP Latest Ref Rng & Units 01/26/2021 10/31/2019 11/06/2018  Glucose 70 - 99 mg/dL 79 01/28/2021) 12/31/2019)  BUN 6 - 23 mg/dL 13 14 13   Creatinine 0.40 - 1.50 mg/dL 01/06/2019 998(P 382(N  Sodium 135 - 145 mEq/L 136 140 145(H)  Potassium 3.5 - 5.1 mEq/L 3.7 4.2 4.1  Chloride 96 - 112 mEq/L 100 103 106  CO2 19 - 32 mEq/L 27 24 24   Calcium 8.4 - 10.5 mg/dL 9.4 9.4 9.3  Total Protein 6.0 - 8.3 g/dL 6.3 6.5 6.3  Total Bilirubin 0.2 - 1.2 mg/dL 0.5 0.2 0.4  Alkaline Phos 39 - 117 U/L 55 89 72  AST 0 - 37 U/L 18 23 26   ALT 0 - 53 U/L 19 23 25     Lipid Panel  Lab Results  Component Value Date   CHOL 153 01/26/2021   HDL 39.40 01/26/2021   LDLCALC 91 01/26/2021   TRIG 114.0 01/26/2021   CHOLHDL 4 01/26/2021    No components found for: NTPROBNP No results for input(s): PROBNP in the last 8760 hours. No results for input(s): TSH in the last 8760 hours.  BMP Recent Labs    01/26/21 1651  NA 136  K 3.7  CL 100  CO2 27  GLUCOSE 79  BUN 13  CREATININE 0.92  CALCIUM 9.4    HEMOGLOBIN A1C Lab Results  Component Value Date   HGBA1C 5.4 09/16/2016   MPG 126 09/18/2015    IMPRESSION:    ICD-10-CM   1. Family history of hypertrophic cardiomyopathy  Z82.49 ECHOCARDIOGRAM COMPLETE    2. Essential hypertension  I10 EKG 12-Lead    ECHOCARDIOGRAM COMPLETE    3. Mixed hyperlipidemia  E78.2        RECOMMENDATIONS: BEXTON HAAK is a 60 y.o. male whose past medical history and cardiac risk factors include: Hypertension, hyperlipidemia erectile dysfunction, GERD, family history of apical hypertrophy (brother).   Family history of hypertrophic cardiomyopathy His brother was recently diagnosed with apical hypertrophic cardiomyopathy.  According to the patient no gene testing was performed. Patient is EKG does not illustrate classic findings of apical hypertrophic cardiomyopathy or left ventricular  hypertrophy. Echocardiogram will be ordered to evaluate for structural heart disease and left ventricular systolic function.  Essential hypertension Office blood pressures are within acceptable range but not at goal. Patient states that his home blood pressures are very well controlled with SBP usually around 120 mmHg. Medications reconciled. Currently managed by primary care provider.  Mixed hyperlipidemia Currently on statin therapy. Most recent LDL within acceptable range. Currently managed by primary care provider.  FINAL MEDICATION LIST END OF ENCOUNTER: No orders of the defined types were placed in this encounter.   Medications Discontinued During This Encounter  Medication Reason   diclofenac (VOLTAREN) 75 MG EC tablet Error   predniSONE (DELTASONE) 20 MG tablet Error   sildenafil (REVATIO) 20 MG tablet Error     Current Outpatient Medications:    benzonatate (TESSALON) 100 MG capsule, Take 1 capsule (100 mg total) by mouth 2 (two) times daily as needed for cough., Disp: 20 capsule, Rfl: 0   Cholecalciferol (VITAMIN D) 400 UNITS capsule, Take 1,000 Units by mouth daily., Disp: , Rfl:    clotrimazole (LOTRIMIN) 1 % cream, Apply 1 application topically 2 (two) times daily., Disp: 30 g, Rfl: 1   fish oil-omega-3 fatty acids 1000 MG capsule, Take 2 g by mouth daily., Disp: , Rfl:    lisinopril (ZESTRIL) 20 MG tablet, Take 1 tablet (20 mg total) by mouth daily., Disp: 90 tablet, Rfl: 1   Multiple Vitamin (MULTIVITAMIN) tablet, Take 1 tablet by mouth daily., Disp: , Rfl:    omeprazole (PRILOSEC) 20 MG capsule, TAKE 1 TABLET BY MOUTH DAILY, Disp: 90 capsule, Rfl: 1   rosuvastatin (CRESTOR) 20 MG tablet, Take 1 tablet (20 mg total) by mouth daily., Disp: 90 tablet, Rfl: 1  Orders Placed This Encounter  Procedures   EKG 12-Lead   ECHOCARDIOGRAM COMPLETE    There are no Patient Instructions on file for this visit.   --Continue cardiac medications as reconciled in final  medication list. --Return in about 4 weeks (around 03/11/2021) for Review test results. Or sooner if needed. --Continue follow-up with your primary care physician regarding the management of your other chronic comorbid conditions.  Patient's questions and concerns were addressed to his satisfaction. He voices understanding of the instructions provided during this encounter.   This note was created using a voice recognition software as a result there may be grammatical errors inadvertently enclosed that do not reflect the nature of this encounter. Every attempt is made to correct such errors.  Tessa Lerner, [place], [facility]  Pager: [phone] Office: [phone]

## 2021-03-17 ENCOUNTER — Ambulatory Visit: Payer: Federal, State, Local not specified - PPO | Admitting: Cardiology

## 2021-03-20 ENCOUNTER — Ambulatory Visit (HOSPITAL_COMMUNITY)
Admission: RE | Admit: 2021-03-20 | Discharge: 2021-03-20 | Disposition: A | Discharge status: Home / Self Care | Payer: Federal, State, Local not specified - PPO | Source: Ambulatory Visit | Attending: Cardiology | Admitting: Cardiology

## 2021-03-20 ENCOUNTER — Other Ambulatory Visit: Payer: Self-pay

## 2021-03-20 DIAGNOSIS — Z8489 Family history of other specified conditions: Secondary | ICD-10-CM | POA: Diagnosis not present

## 2021-03-20 DIAGNOSIS — R011 Cardiac murmur, unspecified: Secondary | ICD-10-CM | POA: Diagnosis not present

## 2021-03-20 DIAGNOSIS — Z8249 Family history of ischemic heart disease and other diseases of the circulatory system: Secondary | ICD-10-CM

## 2021-03-20 DIAGNOSIS — I10 Essential (primary) hypertension: Secondary | ICD-10-CM | POA: Insufficient documentation

## 2021-03-20 LAB — ECHOCARDIOGRAM COMPLETE
Area-P 1/2: 2.65 cm2
S' Lateral: 2.9 cm

## 2021-03-23 NOTE — Telephone Encounter (Signed)
From patient.

## 2021-04-06 ENCOUNTER — Encounter: Payer: Self-pay | Admitting: Cardiology

## 2021-04-06 ENCOUNTER — Other Ambulatory Visit: Payer: Self-pay

## 2021-04-06 ENCOUNTER — Ambulatory Visit: Payer: Federal, State, Local not specified - PPO | Admitting: Cardiology

## 2021-04-06 VITALS — BP 134/79 | HR 67 | Temp 98.4°F | Resp 16 | Ht 69.0 in | Wt 189.4 lb

## 2021-04-06 DIAGNOSIS — I10 Essential (primary) hypertension: Secondary | ICD-10-CM

## 2021-04-06 DIAGNOSIS — E782 Mixed hyperlipidemia: Secondary | ICD-10-CM | POA: Diagnosis not present

## 2021-04-06 DIAGNOSIS — Z8249 Family history of ischemic heart disease and other diseases of the circulatory system: Secondary | ICD-10-CM

## 2021-04-06 NOTE — Progress Notes (Signed)
Date:  04/06/2021   ID:  Drew Miles, DOB 09-Dec-1960, MRN [id number]  PCP:  Wendie Agreste, MD  Cardiologist:  Rex Kras, DO, [facility] (established care 02/11/21 )  Date: 04/06/21 Last Office Visit: 02/11/2021  Chief Complaint  Patient presents with   Results   Follow-up    4 Weeks   HPI  Drew Miles is a 60 y.o. male who presents to the office with a chief complaint of " follow-up for screening of hypertrophic cardiomyopathy." Patient's past medical history and cardiovascular risk factors include: Hypertension, hyperlipidemia erectile dysfunction, GERD, family history of apical hypertrophy (brother).   He is referred to the office at the request of Wendie Agreste, MD for evaluation of hypertension and screening for hypertrophic cardiomyopathy.d  Very pleasant 60 year old gentleman who works for the [employer] comes to the office for evaluation/screening for hypertrophic cardiomyopathy.  Patient states that including him he has 4 brothers and the second oldest brother was recently diagnosed with HCM known to have the apical variant.  No gene testing was performed.  And it was recommended that his first-degree relatives be screened for hypertrophic cardiomyopathy.  Whether his brother's HCM is obstructive or nonobstructive is unknown.  No prior history of near-syncope or syncopal events.  No family history of sudden cardiac death or premature CAD.  At the last office visit shared decision was to proceed with echocardiogram.  Results of the echocardiogram reviewed with him in great detail and noted below for further reference.  Since last visit, patient remains asymptomatic with regards to anginal discomfort or syncopal events.  FUNCTIONAL STATUS: No structured exercise program or daily routine.  But during the spring months he enjoys hiking.  ALLERGIES: Allergies  Allergen Reactions   Ether Other (See Comments)   Anesthetics, Ester Other (See Comments)    INCREASED BP     MEDICATION LIST PRIOR TO VISIT: Current Meds  Medication Sig   Cholecalciferol (VITAMIN D) 400 UNITS capsule Take 1,000 Units by mouth daily.   fish oil-omega-3 fatty acids 1000 MG capsule Take 2 g by mouth daily.   lisinopril (ZESTRIL) 20 MG tablet Take 1 tablet (20 mg total) by mouth daily.   Multiple Vitamin (MULTIVITAMIN) tablet Take 1 tablet by mouth daily.   omeprazole (PRILOSEC) 20 MG capsule TAKE 1 TABLET BY MOUTH DAILY   rosuvastatin (CRESTOR) 20 MG tablet Take 1 tablet (20 mg total) by mouth daily.     PAST MEDICAL HISTORY: Past Medical History:  Diagnosis Date   Allergy    Hyperlipidemia    Hypertension     PAST SURGICAL HISTORY: Past Surgical History:  Procedure Laterality Date   SHOULDER SURGERY  2013   VASECTOMY      FAMILY HISTORY: The patient family history includes Dementia in his father; Diverticulitis in his mother; Epilepsy in his son; Hyperlipidemia in his brother; Mental illness in his brother; Other in his mother.  SOCIAL HISTORY:  The patient  reports that he quit smoking about 40 years ago. His smoking use included cigarettes. He has never used smokeless tobacco. He reports that he does not drink alcohol and does not use drugs.  REVIEW OF SYSTEMS: Review of Systems  Constitutional: Negative for chills and fever.  HENT:  Negative for hoarse voice and nosebleeds.   Eyes:  Negative for discharge, double vision and pain.  Cardiovascular:  Negative for chest pain, claudication, dyspnea on exertion, leg swelling, near-syncope, orthopnea, palpitations, paroxysmal nocturnal dyspnea and syncope.  Respiratory:  Negative for  hemoptysis and shortness of breath.   Musculoskeletal:  Negative for muscle cramps and myalgias.  Gastrointestinal:  Negative for abdominal pain, constipation, diarrhea, hematemesis, hematochezia, melena, nausea and vomiting.  Neurological:  Negative for dizziness and light-headedness.   PHYSICAL EXAM: Vitals with BMI 04/06/2021  02/11/2021 01/26/2021  Height 5\' 9"  5\' 9"  5\' 9"   Weight 189 lbs 6 oz 187 lbs 13 oz 188 lbs 10 oz  BMI 27.96 0000000 [id number]  Systolic Q000111Q Q000111Q [id number]  Diastolic 79 84 68  Pulse 67 81 68    CONSTITUTIONAL: Well-developed and well-nourished. No acute distress.  SKIN: Skin is warm and dry. No rash noted. No cyanosis. No pallor. No jaundice HEAD: Normocephalic and atraumatic.  EYES: No scleral icterus MOUTH/THROAT: Moist oral membranes.  NECK: No JVD present. No thyromegaly noted. No carotid bruits  LYMPHATIC: No visible cervical adenopathy.  CHEST Normal respiratory effort. No intercostal retractions  LUNGS: Clear to auscultation bilaterally. No stridor. No wheezes. No rales.  CARDIOVASCULAR: Regular rate and rhythm, positive S1-S2, no murmurs rubs or gallops appreciated. ABDOMINAL: No apparent ascites.  EXTREMITIES: No peripheral edema  HEMATOLOGIC: No significant bruising NEUROLOGIC: Oriented to person, place, and time. Nonfocal. Normal muscle tone.  PSYCHIATRIC: Normal mood and affect. Normal behavior. Cooperative  CARDIAC DATABASE: EKG: 02/11/2021: Normal sinus rhythm, 66 bpm, normal axis, without underlying ischemia or injury pattern.  Echocardiogram: 03/20/2021: 1. Left ventricular ejection fraction, by estimation, is 60 to 65%. The left ventricle has normal function. The left ventricle has no regional wall motion abnormalities. Left ventricular diastolic parameters were normal. 2. Right ventricular systolic function is normal. The right ventricular size is normal. 3. The mitral valve is normal in structure. Trivial mitral valve regurgitation. No evidence of mitral stenosis. 4. The aortic valve is normal in structure. Aortic valve regurgitation is trivial. No aortic stenosis is present. 5. The inferior vena cava is normal in size with greater than 50% respiratory variability, suggesting right atrial pressure of 3 mmHg.  Stress Testing: No results found for this or any previous visit  from the past 1095 days.  Heart Catheterization: None  LABORATORY DATA: CBC Latest Ref Rng & Units 11/10/2017 09/16/2016 10/31/2014  WBC 3.4 - 10.8 x10E3/uL 7.0 6.3 7.1  Hemoglobin 13.0 - 17.7 g/dL 14.3 14.4 13.8  Hematocrit 37.5 - 51.0 % 43.1 43.4 41.3  Platelets 150 - 450 x10E3/uL 190 196 207    CMP Latest Ref Rng & Units 01/26/2021 10/31/2019 11/06/2018  Glucose 70 - 99 mg/dL 79 101(H) 102(H)  BUN 6 - 23 mg/dL 13 14 13   Creatinine 0.40 - 1.50 mg/dL 0.92 1.07 0.98  Sodium 135 - 145 mEq/L 136 140 145(H)  Potassium 3.5 - 5.1 mEq/L 3.7 4.2 4.1  Chloride 96 - 112 mEq/L 100 103 106  CO2 19 - 32 mEq/L 27 24 24   Calcium 8.4 - 10.5 mg/dL 9.4 9.4 9.3  Total Protein 6.0 - 8.3 g/dL 6.3 6.5 6.3  Total Bilirubin 0.2 - 1.2 mg/dL 0.5 0.2 0.4  Alkaline Phos 39 - 117 U/L 55 89 72  AST 0 - 37 U/L 18 23 26   ALT 0 - 53 U/L 19 23 25     Lipid Panel  Lab Results  Component Value Date   CHOL 153 01/26/2021   HDL 39.40 01/26/2021   LDLCALC 91 01/26/2021   TRIG 114.0 01/26/2021   CHOLHDL 4 01/26/2021    No components found for: NTPROBNP No results for input(s): PROBNP in the last 8760 hours. No results for input(s): TSH  in the last 8760 hours.  BMP Recent Labs    01/26/21 1651  NA 136  K 3.7  CL 100  CO2 27  GLUCOSE 79  BUN 13  CREATININE 0.92  CALCIUM 9.4    HEMOGLOBIN A1C Lab Results  Component Value Date   HGBA1C 5.4 09/16/2016   MPG 126 09/18/2015    IMPRESSION:    ICD-10-CM   1. Family history of hypertrophic cardiomyopathy  Z82.49     2. Essential hypertension  I10     3. Mixed hyperlipidemia  E78.2         RECOMMENDATIONS: Drew Miles is a 60 y.o. male whose past medical history and cardiac risk factors include: Hypertension, hyperlipidemia erectile dysfunction, GERD, family history of apical hypertrophy (brother).   One of his brothers was recently diagnosed with apical variant of hypertrophic cardiomyopathy and patient was seen in consultation for screening for  HCM.  Since last visit he is undergone an echocardiogram which notes preserved LVEF and no echocardiographic findings to suggest HCM as of now.  Clinically he remains asymptomatic and no prior history of syncope.  We did discuss the results of the echo and patient is made aware of the possibility of delayed onset of hypertrophy and recommend echo every 5 years or sooner if change in clinical status.  Patient's blood pressure is well controlled at today's office visit.  Patient states that his home blood pressures are usually better controlled with SBP 120 mmHg.  Will defer additional medication management if needed to PCP.  With regards to mixed hyperlipidemia currently on statin therapy.  Most recent LDL within acceptable range.  Currently managed by primary care provider.  Patient is advised to have an echocardiogram in 5 years and I will see him in follow-up thereafter.  FINAL MEDICATION LIST END OF ENCOUNTER: No orders of the defined types were placed in this encounter.   Medications Discontinued During This Encounter  Medication Reason   benzonatate (TESSALON) 100 MG capsule Error   clotrimazole (LOTRIMIN) 1 % cream Error      Current Outpatient Medications:    Cholecalciferol (VITAMIN D) 400 UNITS capsule, Take 1,000 Units by mouth daily., Disp: , Rfl:    fish oil-omega-3 fatty acids 1000 MG capsule, Take 2 g by mouth daily., Disp: , Rfl:    lisinopril (ZESTRIL) 20 MG tablet, Take 1 tablet (20 mg total) by mouth daily., Disp: 90 tablet, Rfl: 1   Multiple Vitamin (MULTIVITAMIN) tablet, Take 1 tablet by mouth daily., Disp: , Rfl:    omeprazole (PRILOSEC) 20 MG capsule, TAKE 1 TABLET BY MOUTH DAILY, Disp: 90 capsule, Rfl: 1   rosuvastatin (CRESTOR) 20 MG tablet, Take 1 tablet (20 mg total) by mouth daily., Disp: 90 tablet, Rfl: 1  No orders of the defined types were placed in this encounter.   There are no Patient Instructions on file for this visit.   --Continue cardiac  medications as reconciled in final medication list. --Return in about 5 years (around 04/06/2026) for Follow up after echo, family history of HCM. . Or sooner if needed. --Continue follow-up with your primary care physician regarding the management of your other chronic comorbid conditions.  Patient's questions and concerns were addressed to his satisfaction. He voices understanding of the instructions provided during this encounter.   This note was created using a voice recognition software as a result there may be grammatical errors inadvertently enclosed that do not reflect the nature of this encounter. Every attempt is made  to correct such errors.  Total time spent: 23 minutes.  Rex Kras, [place], [facility]  Pager: [phone] Office: [phone]

## 2021-04-09 ENCOUNTER — Ambulatory Visit (INDEPENDENT_AMBULATORY_CARE_PROVIDER_SITE_OTHER): Payer: Federal, State, Local not specified - PPO | Admitting: Family Medicine

## 2021-04-09 ENCOUNTER — Encounter: Payer: Self-pay | Admitting: Family Medicine

## 2021-04-09 VITALS — BP 132/74 | HR 67 | Temp 98.1°F | Resp 16 | Ht 69.0 in | Wt 186.8 lb

## 2021-04-09 DIAGNOSIS — Z0000 Encounter for general adult medical examination without abnormal findings: Secondary | ICD-10-CM | POA: Diagnosis not present

## 2021-04-09 NOTE — Progress Notes (Signed)
Subjective:  Patient ID: Drew Miles, male    DOB: [birth year]  Age: 60 y.o. MRN: [id number]  CC:  Chief Complaint  Patient presents with   Annual Exam    Pt here for annual exam, no concerns     HPI ADANTE COURINGTON presents for   Annual physical exam.  Still hiking. Went to [place], [place], [place], [place].    Hypertension: Lisinopril 20 mg daily Sildenafil for ED, discussed in August.  Echo in 02/2021 (brother with hx of HCM) ok. Repeat in 5 years.  Echocardiogram: 03/20/2021: 1. Left ventricular ejection fraction, by estimation, is 60 to 65%. The left ventricle has normal function. The left ventricle has no regional wall motion abnormalities. Left ventricular diastolic parameters were normal. 2. Right ventricular systolic function is normal. The right ventricular size is normal. 3. The mitral valve is normal in structure. Trivial mitral valve regurgitation. No evidence of mitral stenosis. 4. The aortic valve is normal in structure. Aortic valve regurgitation is trivial. No aortic stenosis is present. 5. The inferior vena cava is normal in size with greater than 50% respiratory variability, suggesting right atrial pressure of 3 mmHg.   BP Readings from Last 3 Encounters:  04/09/21 132/74  04/06/21 134/79  02/11/21 (!) 141/84   Lab Results  Component Value Date   CREATININE 0.92 01/26/2021   Hyperlipidemia: Crestor 20 mg daily Lab Results  Component Value Date   CHOL 153 01/26/2021   HDL 39.40 01/26/2021   LDLCALC 91 01/26/2021   TRIG 114.0 01/26/2021   CHOLHDL 4 01/26/2021   Lab Results  Component Value Date   ALT 19 01/26/2021   AST 18 01/26/2021   ALKPHOS 55 01/26/2021   BILITOT 0.5 01/26/2021   GERD Omeprazole 20 mg daily  Cancer screening: Colonoscopy 07/2015. Repeat next week.  Lab Results  Component Value Date   PSA1 0.9 11/10/2017   PSA1 0.8 09/16/2016   PSA 0.28 09/18/2015   PSA 0.30 10/31/2014   PSA 0.33 12/18/2013  The natural  history of prostate cancer and ongoing controversy regarding screening and potential treatment outcomes of prostate cancer has been discussed with the patient. The meaning of a false positive PSA and a false negative PSA has been discussed. He indicates understanding of the limitations of this screening test and wishes to proceed with screening PSA testing.- will defer to next visit.  No FH of prostate CA.   Immunization History  Administered Date(s) Administered   Influenza Inj Mdck Quad Pf 02/02/2018, 01/30/2019   Influenza Split 02/25/2012   Influenza,inj,Quad PF,6+ Mos 02/06/2013, 02/25/2014, 02/16/2015, 02/21/2017   MMR 11/10/2017   PNEUMOCOCCAL CONJUGATE-20 01/26/2021   Tdap 11/11/2009, 09/29/2020   Zoster Recombinat (Shingrix) 11/13/2017, 01/13/2018  Covid infection late August. Plans on bivalent vaccine in next month.  Flu vaccine at [facility].   Vision Screening   Right eye Left eye Both eyes  Without correction     With correction 20/20 20/25 20/20-1  Optho -visit next week. Glasses.   Dentist - recent visit.   Alcohol: none  Tobacco: none  Exercise: walking and hiking. Discussed resistance exercise.    History Patient Active Problem List   Diagnosis Date Noted   Lateral epicondylitis of right elbow 02/27/2018   Adhesive capsulitis of shoulder 02/27/2018   Shoulder joint pain 02/27/2018   Obesity, unspecified 02/06/2013   Hypertension 02/01/2012   Hyperlipidemia 02/01/2012   Reflux 02/01/2012   Abnormal CT scan 02/01/2012   Past Medical History:  Diagnosis  Date   Allergy    Hyperlipidemia    Hypertension    Past Surgical History:  Procedure Laterality Date   SHOULDER SURGERY  2013   VASECTOMY     Allergies  Allergen Reactions   Ether Other (See Comments)   Anesthetics, Ester Other (See Comments)    INCREASED BP   Prior to Admission medications   Medication Sig Start Date End Date Taking? Authorizing Provider  Cholecalciferol (VITAMIN D) 400 UNITS  capsule Take 1,000 Units by mouth daily.   Yes [provider]  fish oil-omega-3 fatty acids 1000 MG capsule Take 2 g by mouth daily.   Yes [provider]  lisinopril (ZESTRIL) 20 MG tablet Take 1 tablet (20 mg total) by mouth daily. 01/26/21  Yes Wendie Agreste, MD  Multiple Vitamin (MULTIVITAMIN) tablet Take 1 tablet by mouth daily.   Yes [provider]  omeprazole (PRILOSEC) 20 MG capsule TAKE 1 TABLET BY MOUTH DAILY 01/26/21  Yes Wendie Agreste, MD  rosuvastatin (CRESTOR) 20 MG tablet Take 1 tablet (20 mg total) by mouth daily. 01/26/21  Yes Wendie Agreste, MD   Social History   Socioeconomic History   Marital status: Married    Spouse name: Belinda   Number of children: 3   Years of education: Bachelor's   Highest education level: Not on file  Occupational History   Occupation: IT trainer: [employer]  Tobacco Use   Smoking status: Former    Years: 5.00    Types: Cigarettes    Quit date: 05/31/1980    Years since quitting: 40.8   Smokeless tobacco: Never  Vaping Use   Vaping Use: Never used  Substance and Sexual Activity   Alcohol use: No   Drug use: No   Sexual activity: Yes  Other Topics Concern   Not on file  Social History Narrative   Lives with wife   Caffeine use: 3-4 diet cokes per day   Married. Education: [employer]. Exercise: No   Social Determinants of Health   Financial Resource Strain: Not on file  Food Insecurity: Not on file  Transportation Needs: Not on file  Physical Activity: Not on file  Stress: Not on file  Social Connections: Not on file  Intimate Partner Violence: Not on file   Review of Systems 13 point review of systems per patient health survey noted.  Negative other than as indicated above or in HPI.    Objective:   Vitals:   04/09/21 1346  BP: 132/74  Pulse: 67  Resp: 16  Temp: 98.1 F (36.7 C)  TempSrc: Temporal  SpO2: 98%  Weight: 186 lb 12.8 oz (84.7 kg)  Height: '5\' 9"'  (1.753 m)      Physical Exam Vitals reviewed.  Constitutional:      Appearance: He is well-developed.  HENT:     Head: Normocephalic and atraumatic.     Right Ear: External ear normal.     Left Ear: External ear normal.  Eyes:     Conjunctiva/sclera: Conjunctivae normal.     Pupils: Pupils are equal, round, and reactive to light.  Neck:     Thyroid: No thyromegaly.  Cardiovascular:     Rate and Rhythm: Normal rate and regular rhythm.     Heart sounds: Normal heart sounds.  Pulmonary:     Effort: Pulmonary effort is normal. No respiratory distress.     Breath sounds: Normal breath sounds. No wheezing.  Abdominal:     General:  There is no distension.     Palpations: Abdomen is soft.     Tenderness: There is no abdominal tenderness.  Musculoskeletal:        General: No tenderness. Normal range of motion.     Cervical back: Normal range of motion and neck supple.  Lymphadenopathy:     Cervical: No cervical adenopathy.  Skin:    General: Skin is warm and dry.  Neurological:     Mental Status: He is alert and oriented to person, place, and time.     Deep Tendon Reflexes: Reflexes are normal and symmetric.  Psychiatric:        Behavior: Behavior normal.     Assessment & Plan:  CELVIN TANEY is a 60 y.o. male . Annual physical exam  - -anticipatory guidance as below in AVS, screening labs above. Health maintenance items as above in HPI discussed/recommended as applicable.   - plan for PSA with next bloodwork.   - recent echo reassuring.   -chronic diseases stable.    No orders of the defined types were placed in this encounter.  Patient Instructions  Thanks for coming in today. Bloodwork next visit. Let me know if you would like to check PSA sooner.   Keeping you healthy  Get these tests Blood pressure- Have your blood pressure checked once a year by your healthcare provider.  Normal blood pressure is 120/80 Weight- Have your body mass index (BMI) calculated to screen for  obesity.  BMI is a measure of body fat based on height and weight. You can also calculate your own BMI at ViewBanking.si. Cholesterol- Have your cholesterol checked every year. Diabetes- Have your blood sugar checked regularly if you have high blood pressure, high cholesterol, have a family history of diabetes or if you are overweight. Screening for Colon Cancer- Colonoscopy starting at age 1.  Screening may begin sooner depending on your family history and other health conditions. Follow up colonoscopy as directed by your Gastroenterologist. Screening for Prostate Cancer- Both blood work (PSA) and a rectal exam help screen for Prostate Cancer.  Screening begins at age 68 with African-American men and at age 38 with Caucasian men.  Screening may begin sooner depending on your family history.  Take these medicines Aspirin- One aspirin daily can help prevent Heart disease and Stroke. Flu shot- Every fall. Tetanus- Every 10 years. Zostavax- Once after the age of 53 to prevent Shingles. Pneumonia shot- Once after the age of 56; if you are younger than 42, ask your healthcare provider if you need a Pneumonia shot.  Take these steps Don't smoke- If you do smoke, talk to your doctor about quitting.  For tips on how to quit, go to www.smokefree.gov or call 1-800-QUIT-NOW. Be physically active- Exercise 5 days a week for at least 30 minutes.  If you are not already physically active start slow and gradually work up to 30 minutes of moderate physical activity.  Examples of moderate activity include walking briskly, mowing the yard, dancing, swimming, bicycling, etc. Eat a healthy diet- Eat a variety of healthy food such as fruits, vegetables, low fat milk, low fat cheese, yogurt, lean meant, poultry, fish, beans, tofu, etc. For more information go to www.thenutritionsource.org Drink alcohol in moderation- Limit alcohol intake to less than two drinks a day. Never drink and drive. Dentist- Brush and  floss twice daily; visit your dentist twice a year. Depression- Your emotional health is as important as your physical health. If you're feeling down, or losing  interest in things you would normally enjoy please talk to your healthcare provider. Eye exam- Visit your eye doctor every year. Safe sex- If you may be exposed to a sexually transmitted infection, use a condom. Seat belts- Seat belts can save your life; always wear one. Smoke/Carbon Monoxide detectors- These detectors need to be installed on the appropriate level of your home.  Replace batteries at least once a year. Skin cancer- When out in the sun, cover up and use sunscreen 15 SPF or higher. Violence- If anyone is threatening you, please tell your healthcare provider. Living Will/ Health care power of attorney- Speak with your healthcare provider and family.    Signed,   Merri Ray, MD [place], [facility] 04/09/21 3:10 PM

## 2021-04-09 NOTE — Patient Instructions (Signed)
Thanks for coming in today. Bloodwork next visit. Let me know if you would like to check PSA sooner.   Keeping you healthy  Get these tests Blood pressure- Have your blood pressure checked once a year by your healthcare provider.  Normal blood pressure is 120/80 Weight- Have your body mass index (BMI) calculated to screen for obesity.  BMI is a measure of body fat based on height and weight. You can also calculate your own BMI at [employer].[email or web address]. Cholesterol- Have your cholesterol checked every year. Diabetes- Have your blood sugar checked regularly if you have high blood pressure, high cholesterol, have a family history of diabetes or if you are overweight. Screening for Colon Cancer- Colonoscopy starting at age 37.  Screening may begin sooner depending on your family history and other health conditions. Follow up colonoscopy as directed by your Gastroenterologist. Screening for Prostate Cancer- Both blood work (PSA) and a rectal exam help screen for Prostate Cancer.  Screening begins at age 75 with African-American men and at age 66 with Caucasian men.  Screening may begin sooner depending on your family history.  Take these medicines Aspirin- One aspirin daily can help prevent Heart disease and Stroke. Flu shot- Every fall. Tetanus- Every 10 years. Zostavax- Once after the age of 67 to prevent Shingles. Pneumonia shot- Once after the age of 47; if you are younger than 42, ask your healthcare provider if you need a Pneumonia shot.  Take these steps Don't smoke- If you do smoke, talk to your doctor about quitting.  For tips on how to quit, go to www.smokefree.gov or call 1-800-QUIT-NOW. Be physically active- Exercise 5 days a week for at least 30 minutes.  If you are not already physically active start slow and gradually work up to 30 minutes of moderate physical activity.  Examples of moderate activity include walking briskly, mowing the yard, dancing, swimming, bicycling, etc. Eat a  healthy diet- Eat a variety of healthy food such as fruits, vegetables, low fat milk, low fat cheese, yogurt, lean meant, poultry, fish, beans, tofu, etc. For more information go to www.thenutritionsource.org Drink alcohol in moderation- Limit alcohol intake to less than two drinks a day. Never drink and drive. Dentist- Brush and floss twice daily; visit your dentist twice a year. Depression- Your emotional health is as important as your physical health. If you're feeling down, or losing interest in things you would normally enjoy please talk to your healthcare provider. Eye exam- Visit your eye doctor every year. Safe sex- If you may be exposed to a sexually transmitted infection, use a condom. Seat belts- Seat belts can save your life; always wear one. Smoke/Carbon Monoxide detectors- These detectors need to be installed on the appropriate level of your home.  Replace batteries at least once a year. Skin cancer- When out in the sun, cover up and use sunscreen 15 SPF or higher. Violence- If anyone is threatening you, please tell your healthcare provider. Living Will/ Health care power of attorney- Speak with your healthcare provider and family.

## 2021-04-16 DIAGNOSIS — Z86010 Personal history of colonic polyps: Secondary | ICD-10-CM | POA: Diagnosis not present

## 2021-04-16 DIAGNOSIS — K648 Other hemorrhoids: Secondary | ICD-10-CM | POA: Diagnosis not present

## 2021-04-16 DIAGNOSIS — K5730 Diverticulosis of large intestine without perforation or abscess without bleeding: Secondary | ICD-10-CM | POA: Diagnosis not present

## 2021-07-20 ENCOUNTER — Other Ambulatory Visit: Payer: Self-pay | Admitting: Family Medicine

## 2021-07-20 DIAGNOSIS — I10 Essential (primary) hypertension: Secondary | ICD-10-CM

## 2021-07-20 DIAGNOSIS — K219 Gastro-esophageal reflux disease without esophagitis: Secondary | ICD-10-CM

## 2022-01-08 ENCOUNTER — Encounter: Payer: Self-pay | Admitting: Family Medicine

## 2022-01-13 ENCOUNTER — Other Ambulatory Visit: Payer: Self-pay | Admitting: Family Medicine

## 2022-01-13 DIAGNOSIS — I10 Essential (primary) hypertension: Secondary | ICD-10-CM

## 2022-01-13 DIAGNOSIS — K219 Gastro-esophageal reflux disease without esophagitis: Secondary | ICD-10-CM

## 2022-01-14 ENCOUNTER — Ambulatory Visit: Payer: Federal, State, Local not specified - PPO | Admitting: Podiatry

## 2022-01-14 ENCOUNTER — Encounter: Payer: Self-pay | Admitting: Podiatry

## 2022-01-14 ENCOUNTER — Ambulatory Visit (INDEPENDENT_AMBULATORY_CARE_PROVIDER_SITE_OTHER): Payer: Federal, State, Local not specified - PPO

## 2022-01-14 DIAGNOSIS — M7751 Other enthesopathy of right foot: Secondary | ICD-10-CM | POA: Diagnosis not present

## 2022-01-14 DIAGNOSIS — M216X9 Other acquired deformities of unspecified foot: Secondary | ICD-10-CM

## 2022-01-14 DIAGNOSIS — M722 Plantar fascial fibromatosis: Secondary | ICD-10-CM

## 2022-01-14 MED ORDER — TRIAMCINOLONE ACETONIDE 10 MG/ML IJ SUSP
10.0000 mg | Freq: Once | INTRAMUSCULAR | Status: AC
  Administered 2022-01-14: 10 mg

## 2022-01-14 NOTE — Progress Notes (Signed)
Subjective:   Patient ID: Drew Miles, male   DOB: 61 y.o.   MRN: [id number]   HPI Patient presents stating that he has developed pain in the bottom of his right foot for several months and is quite active with hiking and walking.  Patient states feels like he is walking on a marble no tingling numbness noted neuro   ROS      Objective:  Physical Exam  Vascular status intact with inflammation that occurs both the second MPJ of that it appears that there is a plantarflexion of the second metatarsal head itself with pressure against the bone structure also noted.       Assessment:  Inflammatory capsulitis second MPJ with possibility also for plantarflexed metatarsal that is creating plantar pressure with pain more intense with socks or barefoot     Plan:  H&P x-ray reviewed today I did a forefoot block 60 mg Xylocaine Marcaine I did sterile prep I aspirated second MPJ getting out a small amount of clear fluid injected quarter cc dexamethasone Kenalog and advised this patient we may have to make him an orthotic to offload the second metatarsal head.  Reappoint 3 weeks see the results and decide what else may need to be done Planter fasciitis under control currently  X-rays indicate normal metatarsal parabola with large plantar bone spur heel right

## 2022-01-24 ENCOUNTER — Encounter: Payer: Self-pay | Admitting: Family Medicine

## 2022-01-25 NOTE — Telephone Encounter (Signed)
Called pt to schedule and pt declined

## 2022-01-25 NOTE — Telephone Encounter (Signed)
Message sent to patient, please call to schedule acute appointment - ideally in person, but virtual if that is only option. Thanks.

## 2022-01-26 DIAGNOSIS — L255 Unspecified contact dermatitis due to plants, except food: Secondary | ICD-10-CM | POA: Diagnosis not present

## 2022-02-04 ENCOUNTER — Encounter: Payer: Self-pay | Admitting: Podiatry

## 2022-02-04 ENCOUNTER — Ambulatory Visit: Payer: Federal, State, Local not specified - PPO | Admitting: Podiatry

## 2022-02-04 DIAGNOSIS — M2041 Other hammer toe(s) (acquired), right foot: Secondary | ICD-10-CM

## 2022-02-04 DIAGNOSIS — M216X9 Other acquired deformities of unspecified foot: Secondary | ICD-10-CM

## 2022-02-04 DIAGNOSIS — M216X2 Other acquired deformities of left foot: Secondary | ICD-10-CM | POA: Diagnosis not present

## 2022-02-04 DIAGNOSIS — M7751 Other enthesopathy of right foot: Secondary | ICD-10-CM

## 2022-02-04 NOTE — Progress Notes (Signed)
Subjective:   Patient ID: Drew Miles, male   DOB: 61 y.o.   MRN: [id number]   HPI Patient presents stating that he has had some improvement but still has a lot of pain feels like he is walking on a bone   ROS      Objective:  Physical Exam  Neurovascular status was found to be intact with patient having moderate digital deformities digits 2 3 right and prominence of the second metatarsal head right.  The joint itself which was inflamed is now better with a capsular injection that was done 1 month ago but he still feels that he is walking on it     Assessment:  Probability for plantarflexed second metatarsal right with digital deformity contributing to this underlying deformity and the pain he is experiencing     Plan:  H&P reviewed condition with patient and recommended orthotics to try to offload weight.  Discussed with him ultimately this may require some form of osteotomy with digital fusion but that we would rather try conservative to the best of ability.  Today I went ahead and casted him for functional orthotic devices and utilized Betadine to identify the spot which will be cut out to try to take pressure off the second metatarsal head.  Patient will be seen back when ready and you

## 2022-03-02 ENCOUNTER — Telehealth: Payer: Self-pay | Admitting: Podiatry

## 2022-03-02 NOTE — Telephone Encounter (Signed)
Lvm for patient to call and schedule appointment for opu.

## 2022-03-03 ENCOUNTER — Ambulatory Visit (INDEPENDENT_AMBULATORY_CARE_PROVIDER_SITE_OTHER): Payer: Federal, State, Local not specified - PPO | Admitting: Podiatry

## 2022-03-03 DIAGNOSIS — M7751 Other enthesopathy of right foot: Secondary | ICD-10-CM

## 2022-03-03 DIAGNOSIS — M216X9 Other acquired deformities of unspecified foot: Secondary | ICD-10-CM

## 2022-03-03 NOTE — Progress Notes (Signed)
Patient presents today to pick up custom molded foot orthotics recommended by Dr. REGAL.   Orthotics were dispensed and fit was satisfactory. Reviewed instructions for break-in and wear. Written instructions given to patient.  Patient will follow up as needed.    

## 2022-06-21 ENCOUNTER — Ambulatory Visit (INDEPENDENT_AMBULATORY_CARE_PROVIDER_SITE_OTHER): Payer: Federal, State, Local not specified - PPO | Admitting: Family Medicine

## 2022-06-21 VITALS — BP 128/80 | HR 75 | Temp 97.8°F | Ht 69.0 in | Wt 204.8 lb

## 2022-06-21 DIAGNOSIS — Z0000 Encounter for general adult medical examination without abnormal findings: Secondary | ICD-10-CM | POA: Diagnosis not present

## 2022-06-21 DIAGNOSIS — K219 Gastro-esophageal reflux disease without esophagitis: Secondary | ICD-10-CM

## 2022-06-21 DIAGNOSIS — I10 Essential (primary) hypertension: Secondary | ICD-10-CM | POA: Diagnosis not present

## 2022-06-21 DIAGNOSIS — Z131 Encounter for screening for diabetes mellitus: Secondary | ICD-10-CM | POA: Diagnosis not present

## 2022-06-21 DIAGNOSIS — E785 Hyperlipidemia, unspecified: Secondary | ICD-10-CM | POA: Diagnosis not present

## 2022-06-21 DIAGNOSIS — Z125 Encounter for screening for malignant neoplasm of prostate: Secondary | ICD-10-CM

## 2022-06-21 DIAGNOSIS — N529 Male erectile dysfunction, unspecified: Secondary | ICD-10-CM

## 2022-06-21 MED ORDER — OMEPRAZOLE 20 MG PO CPDR: 20.0000 mg | DELAYED_RELEASE_CAPSULE | Freq: Every day | ORAL | 1 refills | Status: DC

## 2022-06-21 MED ORDER — SILDENAFIL CITRATE 20 MG PO TABS: 10.0000 mg | ORAL_TABLET | Freq: Every day | ORAL | 5 refills | Status: DC | PRN

## 2022-06-21 MED ORDER — LISINOPRIL 20 MG PO TABS: 20.0000 mg | ORAL_TABLET | Freq: Every day | ORAL | 3 refills | Status: DC

## 2022-06-21 MED ORDER — ROSUVASTATIN CALCIUM 20 MG PO TABS: 20.0000 mg | ORAL_TABLET | Freq: Every day | ORAL | 3 refills | Status: DC

## 2022-06-21 NOTE — Progress Notes (Signed)
Subjective:  Patient ID: Drew Miles, male    DOB: [birth year]  Age: 62 y.o. MRN: [id number]  CC:  Chief Complaint  Patient presents with   Annual Exam    Pt is fasting     HPI Drew Miles presents for Annual Exam  Followed by podiatry, Dr. Paulla Dolly for capsulitis of metatarsal head, cavus deformity, hammertoe, visit in September 2023.  Gastroenterology, Dr. Michail Sermon  Cardiology Dr. Terri Skains, family history of hypertrophic cardiomyopathy.  Office visit 2022, follow-up 2027 for follow-up after echo with family history of HCM.  Hypertension: Lisinopril 20 mg daily. No new side effects.  Home readings: 782'U systolic.  BP Readings from Last 3 Encounters:  06/21/22 128/80  04/09/21 132/74  04/06/21 134/79   Lab Results  Component Value Date   CREATININE 0.92 01/26/2021   Hyperlipidemia: Crestor 20 mg daily. No new myalgias. Some stiff back at times, improves with heating pad. Occasional leg cramps, rare.  Lab Results  Component Value Date   CHOL 153 01/26/2021   HDL 39.40 01/26/2021   LDLCALC 91 01/26/2021   TRIG 114.0 01/26/2021   CHOLHDL 4 01/26/2021   Lab Results  Component Value Date   ALT 19 01/26/2021   AST 18 01/26/2021   ALKPHOS 55 01/26/2021   BILITOT 0.5 01/26/2021   GERD Omeprazole 20 mg occasionally - stable with 2x/week dosing.       06/21/2022    3:01 PM 04/09/2021    1:49 PM 09/29/2020    1:58 PM 10/31/2019   11:06 AM 05/03/2019    8:07 AM  Depression screen PHQ 2/9  Decreased Interest 0 0 0 0 0  Down, Depressed, Hopeless 0 0 0 0 0  PHQ - 2 Score 0 0 0 0 0  Altered sleeping 0      Tired, decreased energy 0      Change in appetite 0      Feeling bad or failure about yourself  0      Trouble concentrating 0      Moving slowly or fidgety/restless 0      Suicidal thoughts 0      PHQ-9 Score 0        Health Maintenance  Topic Date Due   COVID-19 Vaccine (1) 07/07/2022 (Originally 04/19/1961)   COLONOSCOPY (Pts 45-1yrs Insurance coverage will  need to be confirmed)  08/14/2025   DTaP/Tdap/Td (3 - Td or Tdap) 09/30/2030   INFLUENZA VACCINE  Completed   Hepatitis C Screening  Completed   HIV Screening  Completed   Zoster Vaccines- Shingrix  Completed   HPV VACCINES  Aged Out  Colonoscopy - 2022 - repeat in 7 yrs.   Prostate: does not have family history of prostate cancer The natural history of prostate cancer and ongoing controversy regarding screening and potential treatment outcomes of prostate cancer has been discussed with the patient. The meaning of a false positive PSA and a false negative PSA has been discussed. He indicates understanding of the limitations of this screening test and wishes to proceed with screening PSA testing. Lab Results  Component Value Date   PSA1 0.9 11/10/2017   PSA1 0.8 09/16/2016   PSA 0.28 09/18/2015   PSA 0.30 10/31/2014   PSA 0.33 12/18/2013   Mother with ovarian CA, no other FH changes.   Immunization History  Administered Date(s) Administered   Influenza Inj Mdck Quad Pf 02/02/2018, 01/30/2019   Influenza Split 02/25/2012   Influenza,inj,Quad PF,6+ Mos 02/06/2013, 02/25/2014, 02/16/2015, 02/21/2017  MMR 11/10/2017   PNEUMOCOCCAL CONJUGATE-20 01/26/2021   Tdap 11/11/2009, 09/29/2020   Zoster Recombinat (Shingrix) 11/13/2017, 01/13/2018   No results found. Optho - last visit in October.   Dental: every 4 months cleaning.   Alcohol: none  Tobacco: none.   Exercise: some hiking. Walking for exercise at least 11min. Trip to [place] in July.    Erectile dysfunction: Sildenafil  20mg  1/2- 1 pill works well. No HA/flushing, hearing/vision changes or CP with exertion.   History Patient Active Problem List   Diagnosis Date Noted   Lateral epicondylitis of right elbow 02/27/2018   Adhesive capsulitis of shoulder 02/27/2018   Shoulder joint pain 02/27/2018   Obesity, unspecified 02/06/2013   Hypertension 02/01/2012   Hyperlipidemia 02/01/2012   Reflux 02/01/2012   Abnormal CT  scan 02/01/2012   Past Medical History:  Diagnosis Date   Allergy    Hyperlipidemia    Hypertension    Past Surgical History:  Procedure Laterality Date   SHOULDER SURGERY  2013   VASECTOMY     Allergies  Allergen Reactions   Ether Other (See Comments)   Anesthetics, Ester Other (See Comments)    INCREASED BP   Prior to Admission medications   Medication Sig Start Date End Date Taking? Authorizing Provider  Cholecalciferol (VITAMIN D) 400 UNITS capsule Take 1,000 Units by mouth daily.   Yes [provider]  fish oil-omega-3 fatty acids 1000 MG capsule Take 2 g by mouth daily.   Yes [provider]  lisinopril (ZESTRIL) 20 MG tablet TAKE 1 TABLET BY MOUTH EVERY DAY 01/13/22  Yes Wendie Agreste, MD  Multiple Vitamin (MULTIVITAMIN) tablet Take 1 tablet by mouth daily.   Yes [provider]  omeprazole (PRILOSEC) 20 MG capsule TAKE 1 CAPSULE BY MOUTH EVERY DAY 01/13/22  Yes Wendie Agreste, MD  rosuvastatin (CRESTOR) 20 MG tablet TAKE 1 TABLET BY MOUTH EVERY DAY 01/13/22  Yes Wendie Agreste, MD   Social History   Socioeconomic History   Marital status: Married    Spouse name: Belinda   Number of children: 3   Years of education: Bachelor's   Highest education level: Not on file  Occupational History   Occupation: IT trainer: [employer]  Tobacco Use   Smoking status: Former    Years: 5.00    Types: Cigarettes    Quit date: 05/31/1980    Years since quitting: 42.0   Smokeless tobacco: Never  Vaping Use   Vaping Use: Never used  Substance and Sexual Activity   Alcohol use: No   Drug use: No   Sexual activity: Yes  Other Topics Concern   Not on file  Social History Narrative   Lives with wife   Caffeine use: 3-4 diet cokes per day   Married. Education: [employer]. Exercise: No   Social Determinants of Health   Financial Resource Strain: Not on file  Food Insecurity: Not on file  Transportation Needs: Not on file  Physical  Activity: Not on file  Stress: Not on file  Social Connections: Not on file  Intimate Partner Violence: Not on file    Review of Systems 13 point review of systems per patient health survey noted.  Negative other than as indicated above or in HPI.    Objective:   Vitals:   06/21/22 1458  BP: 128/80  Pulse: 75  Temp: 97.8 F (36.6 C)  TempSrc: Oral  SpO2: 97%  Weight: 204 lb 12.8 oz (92.9 kg)  Height: 5\' 9"  (1.753 m)     Physical Exam Vitals reviewed.  Constitutional:      Appearance: He is well-developed.  HENT:     Head: Normocephalic and atraumatic.     Right Ear: External ear normal.     Left Ear: External ear normal.  Eyes:     Conjunctiva/sclera: Conjunctivae normal.     Pupils: Pupils are equal, round, and reactive to light.  Neck:     Thyroid: No thyromegaly.  Cardiovascular:     Rate and Rhythm: Normal rate and regular rhythm.     Heart sounds: Normal heart sounds.  Pulmonary:     Effort: Pulmonary effort is normal. No respiratory distress.     Breath sounds: Normal breath sounds. No wheezing.  Abdominal:     General: There is no distension.     Palpations: Abdomen is soft.     Tenderness: There is no abdominal tenderness.  Musculoskeletal:        General: No tenderness. Normal range of motion.     Cervical back: Normal range of motion and neck supple.  Lymphadenopathy:     Cervical: No cervical adenopathy.  Skin:    General: Skin is warm and dry.  Neurological:     Mental Status: He is alert and oriented to person, place, and time.     Deep Tendon Reflexes: Reflexes are normal and symmetric.  Psychiatric:        Behavior: Behavior normal.        Assessment & Plan:  Drew Miles is a 62 y.o. male . Annual physical exam - Plan: Comprehensive metabolic panel, Lipid panel, Hemoglobin A1c, PSA  - -anticipatory guidance as below in AVS, screening labs above. Health maintenance items as above in HPI discussed/recommended as applicable.    Hyperlipidemia, unspecified hyperlipidemia type - Plan: Comprehensive metabolic panel, Lipid panel, rosuvastatin (CRESTOR) 20 MG tablet  -  Stable, tolerating current regimen. Medications refilled. Labs pending as above.   Essential hypertension - Plan: Comprehensive metabolic panel, lisinopril (ZESTRIL) 20 MG tablet  -  Stable, tolerating current regimen. Medications refilled. Labs pending as above.   Screening for diabetes mellitus - Plan: Comprehensive metabolic panel, Hemoglobin A1c  Screening for prostate cancer - Plan: PSA  Gastroesophageal reflux disease, unspecified whether esophagitis present - Plan: omeprazole (PRILOSEC) 20 MG capsule  -Stable with intermittent PPI dosing  Erectile dysfunction, unspecified erectile dysfunction type - Plan: sildenafil (REVATIO) 20 MG tablet, DISCONTINUED: sildenafil (REVATIO) 20 MG tablet  -Sildenafil Rx given - use lowest effective dose.  Has tolerated low-dose with good results as above.  Side effects discussed (including but not limited to headache/flushing, blue discoloration of vision, possible vascular steal and risk of cardiac effects if underlying unknown coronary artery disease, and permanent sensorineural hearing loss). Understanding expressed.   Meds ordered this encounter  Medications   lisinopril (ZESTRIL) 20 MG tablet    Sig: Take 1 tablet (20 mg total) by mouth daily.    Dispense:  90 tablet    Refill:  3   omeprazole (PRILOSEC) 20 MG capsule    Sig: Take 1 capsule (20 mg total) by mouth daily.    Dispense:  90 capsule    Refill:  1    Ok to place on hold.   rosuvastatin (CRESTOR) 20 MG tablet    Sig: Take 1 tablet (20 mg total) by mouth daily.    Dispense:  90 tablet    Refill:  3   DISCONTD: sildenafil (REVATIO)  20 MG tablet    Sig: Take 0.5-1 tablets (10-20 mg total) by mouth daily as needed.    Dispense:  10 tablet    Refill:  5   sildenafil (REVATIO) 20 MG tablet    Sig: Take 0.5-1 tablets (10-20 mg total) by  mouth daily as needed.    Dispense:  10 tablet    Refill:  5   Patient Instructions  No med changes for now.  If any concerns on labs I will let you know. Take care!  Preventive Care 6-67 Years Old, Male Preventive care refers to lifestyle choices and visits with your health care provider that can promote health and wellness. Preventive care visits are also called wellness exams. What can I expect for my preventive care visit? Counseling During your preventive care visit, your health care provider may ask about your: Medical history, including: Past medical problems. Family medical history. Current health, including: Emotional well-being. Home life and relationship well-being. Sexual activity. Lifestyle, including: Alcohol, nicotine or tobacco, and drug use. Access to firearms. Diet, exercise, and sleep habits. Safety issues such as seatbelt and bike helmet use. Sunscreen use. Work and work Astronomer. Physical exam Your health care provider will check your: Height and weight. These may be used to calculate your BMI (body mass index). BMI is a measurement that tells if you are at a healthy weight. Waist circumference. This measures the distance around your waistline. This measurement also tells if you are at a healthy weight and may help predict your risk of certain diseases, such as type 2 diabetes and high blood pressure. Heart rate and blood pressure. Body temperature. Skin for abnormal spots. What immunizations do I need?  Vaccines are usually given at various ages, according to a schedule. Your health care provider will recommend vaccines for you based on your age, medical history, and lifestyle or other factors, such as travel or where you work. What tests do I need? Screening Your health care provider may recommend screening tests for certain conditions. This may include: Lipid and cholesterol levels. Diabetes screening. This is done by checking your blood sugar  (glucose) after you have not eaten for a while (fasting). Hepatitis B test. Hepatitis C test. HIV (human immunodeficiency virus) test. STI (sexually transmitted infection) testing, if you are at risk. Lung cancer screening. Prostate cancer screening. Colorectal cancer screening. Talk with your health care provider about your test results, treatment options, and if necessary, the need for more tests. Follow these instructions at home: Eating and drinking  Eat a diet that includes fresh fruits and vegetables, whole grains, lean protein, and low-fat dairy products. Take vitamin and mineral supplements as recommended by your health care provider. Do not drink alcohol if your health care provider tells you not to drink. If you drink alcohol: Limit how much you have to 0-2 drinks a day. Know how much alcohol is in your drink. In the [place] one drink equals one 12 oz bottle of beer (355 mL), one 5 oz glass of wine (148 mL), or one 1 oz glass of hard liquor (44 mL). Lifestyle Brush your teeth every morning and night with fluoride toothpaste. Floss one time each day. Exercise for at least 30 minutes 5 or more days each week. Do not use any products that contain nicotine or tobacco. These products include cigarettes, chewing tobacco, and vaping devices, such as e-cigarettes. If you need help quitting, ask your health care provider. Do not use drugs. If you are sexually active, practice  safe sex. Use a condom or other form of protection to prevent STIs. Take aspirin only as told by your health care provider. Make sure that you understand how much to take and what form to take. Work with your health care provider to find out whether it is safe and beneficial for you to take aspirin daily. Find healthy ways to manage stress, such as: Meditation, yoga, or listening to music. Journaling. Talking to a trusted person. Spending time with friends and family. Minimize exposure to UV radiation to reduce  your risk of skin cancer. Safety Always wear your seat belt while driving or riding in a vehicle. Do not drive: If you have been drinking alcohol. Do not ride with someone who has been drinking. When you are tired or distracted. While texting. If you have been using any mind-altering substances or drugs. Wear a helmet and other protective equipment during sports activities. If you have firearms in your house, make sure you follow all gun safety procedures. What's next? Go to your health care provider once a year for an annual wellness visit. Ask your health care provider how often you should have your eyes and teeth checked. Stay up to date on all vaccines. This information is not intended to replace advice given to you by your health care provider. Make sure you discuss any questions you have with your health care provider. Document Revised: 11/12/2020 Document Reviewed: 11/12/2020 Elsevier Patient Education  2023 Elsevier Inc.     Signed,   Meredith Staggers, MD [facility], [facility] 06/21/22 3:21 PM

## 2022-06-21 NOTE — Patient Instructions (Signed)
No med changes for now.  If any concerns on labs I will let you know. Take care!  Preventive Care 51-61 Years Old, Male Preventive care refers to lifestyle choices and visits with your health care provider that can promote health and wellness. Preventive care visits are also called wellness exams. What can I expect for my preventive care visit? Counseling During your preventive care visit, your health care provider may ask about your: Medical history, including: Past medical problems. Family medical history. Current health, including: Emotional well-being. Home life and relationship well-being. Sexual activity. Lifestyle, including: Alcohol, nicotine or tobacco, and drug use. Access to firearms. Diet, exercise, and sleep habits. Safety issues such as seatbelt and bike helmet use. Sunscreen use. Work and work Statistician. Physical exam Your health care provider will check your: Height and weight. These may be used to calculate your BMI (body mass index). BMI is a measurement that tells if you are at a healthy weight. Waist circumference. This measures the distance around your waistline. This measurement also tells if you are at a healthy weight and may help predict your risk of certain diseases, such as type 2 diabetes and high blood pressure. Heart rate and blood pressure. Body temperature. Skin for abnormal spots. What immunizations do I need?  Vaccines are usually given at various ages, according to a schedule. Your health care provider will recommend vaccines for you based on your age, medical history, and lifestyle or other factors, such as travel or where you work. What tests do I need? Screening Your health care provider may recommend screening tests for certain conditions. This may include: Lipid and cholesterol levels. Diabetes screening. This is done by checking your blood sugar (glucose) after you have not eaten for a while (fasting). Hepatitis B test. Hepatitis C  test. HIV (human immunodeficiency virus) test. STI (sexually transmitted infection) testing, if you are at risk. Lung cancer screening. Prostate cancer screening. Colorectal cancer screening. Talk with your health care provider about your test results, treatment options, and if necessary, the need for more tests. Follow these instructions at home: Eating and drinking  Eat a diet that includes fresh fruits and vegetables, whole grains, lean protein, and low-fat dairy products. Take vitamin and mineral supplements as recommended by your health care provider. Do not drink alcohol if your health care provider tells you not to drink. If you drink alcohol: Limit how much you have to 0-2 drinks a day. Know how much alcohol is in your drink. In the [place] one drink equals one 12 oz bottle of beer (355 mL), one 5 oz glass of wine (148 mL), or one 1 oz glass of hard liquor (44 mL). Lifestyle Brush your teeth every morning and night with fluoride toothpaste. Floss one time each day. Exercise for at least 30 minutes 5 or more days each week. Do not use any products that contain nicotine or tobacco. These products include cigarettes, chewing tobacco, and vaping devices, such as e-cigarettes. If you need help quitting, ask your health care provider. Do not use drugs. If you are sexually active, practice safe sex. Use a condom or other form of protection to prevent STIs. Take aspirin only as told by your health care provider. Make sure that you understand how much to take and what form to take. Work with your health care provider to find out whether it is safe and beneficial for you to take aspirin daily. Find healthy ways to manage stress, such as: Meditation, yoga, or listening to music.  Journaling. Talking to a trusted person. Spending time with friends and family. Minimize exposure to UV radiation to reduce your risk of skin cancer. Safety Always wear your seat belt while driving or riding in a  vehicle. Do not drive: If you have been drinking alcohol. Do not ride with someone who has been drinking. When you are tired or distracted. While texting. If you have been using any mind-altering substances or drugs. Wear a helmet and other protective equipment during sports activities. If you have firearms in your house, make sure you follow all gun safety procedures. What's next? Go to your health care provider once a year for an annual wellness visit. Ask your health care provider how often you should have your eyes and teeth checked. Stay up to date on all vaccines. This information is not intended to replace advice given to you by your health care provider. Make sure you discuss any questions you have with your health care provider. Document Revised: 11/12/2020 Document Reviewed: 11/12/2020 Elsevier Patient Education  [place].

## 2022-06-22 LAB — COMPREHENSIVE METABOLIC PANEL
ALT: 17 U/L (ref 0–53)
AST: 18 U/L (ref 0–37)
Albumin: 4.5 g/dL (ref 3.5–5.2)
Alkaline Phosphatase: 52 U/L (ref 39–117)
BUN: 11 mg/dL (ref 6–23)
CO2: 30 mEq/L (ref 19–32)
Calcium: 9.4 mg/dL (ref 8.4–10.5)
Chloride: 101 mEq/L (ref 96–112)
Creatinine, Ser: 0.95 mg/dL (ref 0.40–1.50)
GFR: 86.9 mL/min (ref >60.00)
Glucose, Bld: 82 mg/dL (ref 70–99)
Potassium: 4.3 mEq/L (ref 3.5–5.1)
Sodium: 140 mEq/L (ref 135–145)
Total Bilirubin: 0.5 mg/dL (ref 0.2–1.2)
Total Protein: 6.5 g/dL (ref 6.0–8.3)

## 2022-06-22 LAB — LIPID PANEL
Cholesterol: 153 mg/dL (ref 0–200)
HDL: 47.2 mg/dL (ref >39.00)
LDL Cholesterol: 74 mg/dL (ref 0–99)
NonHDL: 105.78
Total CHOL/HDL Ratio: 3
Triglycerides: 160 mg/dL — ABNORMAL HIGH (ref 0.0–149.0)
VLDL: 32 mg/dL (ref 0.0–40.0)

## 2022-06-22 LAB — HEMOGLOBIN A1C: Hgb A1c MFr Bld: 6 % (ref 4.6–6.5)

## 2022-06-22 LAB — PSA: PSA: 0.55 ng/mL (ref 0.10–4.00)

## 2022-10-01 DIAGNOSIS — J029 Acute pharyngitis, unspecified: Secondary | ICD-10-CM | POA: Diagnosis not present

## 2022-10-01 DIAGNOSIS — R051 Acute cough: Secondary | ICD-10-CM | POA: Diagnosis not present

## 2022-10-01 DIAGNOSIS — J22 Unspecified acute lower respiratory infection: Secondary | ICD-10-CM | POA: Diagnosis not present

## 2023-04-07 ENCOUNTER — Encounter: Payer: Self-pay | Admitting: Family Medicine

## 2023-04-07 DIAGNOSIS — I10 Essential (primary) hypertension: Secondary | ICD-10-CM | POA: Diagnosis not present

## 2023-07-09 ENCOUNTER — Other Ambulatory Visit: Payer: Self-pay | Admitting: Family Medicine

## 2023-07-09 DIAGNOSIS — I10 Essential (primary) hypertension: Secondary | ICD-10-CM

## 2023-07-10 ENCOUNTER — Other Ambulatory Visit: Payer: Self-pay | Admitting: Family Medicine

## 2023-07-10 DIAGNOSIS — E785 Hyperlipidemia, unspecified: Secondary | ICD-10-CM

## 2023-07-27 ENCOUNTER — Ambulatory Visit: Payer: Federal, State, Local not specified - PPO | Admitting: Family Medicine

## 2023-07-27 ENCOUNTER — Encounter: Payer: Self-pay | Admitting: Family Medicine

## 2023-07-27 VITALS — BP 124/78 | HR 73 | Temp 98.5°F | Ht 69.0 in | Wt 239.2 lb

## 2023-07-27 DIAGNOSIS — N529 Male erectile dysfunction, unspecified: Secondary | ICD-10-CM | POA: Diagnosis not present

## 2023-07-27 DIAGNOSIS — E785 Hyperlipidemia, unspecified: Secondary | ICD-10-CM

## 2023-07-27 DIAGNOSIS — R7303 Prediabetes: Secondary | ICD-10-CM | POA: Diagnosis not present

## 2023-07-27 DIAGNOSIS — K219 Gastro-esophageal reflux disease without esophagitis: Secondary | ICD-10-CM

## 2023-07-27 DIAGNOSIS — K5730 Diverticulosis of large intestine without perforation or abscess without bleeding: Secondary | ICD-10-CM | POA: Insufficient documentation

## 2023-07-27 DIAGNOSIS — Z860100 Personal history of colon polyps, unspecified: Secondary | ICD-10-CM | POA: Insufficient documentation

## 2023-07-27 DIAGNOSIS — I10 Essential (primary) hypertension: Secondary | ICD-10-CM | POA: Diagnosis not present

## 2023-07-27 MED ORDER — SILDENAFIL CITRATE 20 MG PO TABS: 10.0000 mg | ORAL_TABLET | Freq: Every day | ORAL | 5 refills | Status: DC | PRN

## 2023-07-27 MED ORDER — ROSUVASTATIN CALCIUM 20 MG PO TABS: 20.0000 mg | ORAL_TABLET | Freq: Every day | ORAL | 3 refills | Status: DC

## 2023-07-27 MED ORDER — OMEPRAZOLE 20 MG PO CPDR: 20.0000 mg | DELAYED_RELEASE_CAPSULE | Freq: Every day | ORAL | 1 refills | Status: DC

## 2023-07-27 MED ORDER — LISINOPRIL 20 MG PO TABS: 20.0000 mg | ORAL_TABLET | Freq: Every day | ORAL | 3 refills | Status: DC

## 2023-07-27 NOTE — Patient Instructions (Addendum)
 I will check labs. Starting metformin may be an option with prediabetes, but would like to see labs first. No changes for now.  Thanks for coming in today.

## 2023-07-27 NOTE — Progress Notes (Signed)
 Subjective:  Patient ID: Drew Miles, male    DOB: 02/18/1961  Age: 63 y.o. MRN: [id number]  CC:  Chief Complaint  Patient presents with   Medical Management of Chronic Issues    Pt is well.     Health Maintenance    Pt would like to discuss COVID vaccine   Weight Loss    Pt would like to discuss medication notes he would like to discuss metformin     HPI Drew Miles presents for  Management of chronic conditions and concerns as above.  Last visit with me in January 2024, no barriers to care. Will be retiring in August.   Hypertension: Lisinopril 20 mg daily, cardiologist Dr. Odis Hollingshead with family history of hypertrophic cardiomyopathy.  No med side effects.  Home readings: 120/70's.  BP Readings from Last 3 Encounters:  07/27/23 124/78  06/21/22 128/80  04/09/21 132/74   Lab Results  Component Value Date   CREATININE 0.95 06/21/2022   Hyperlipidemia: Crestor 20 mg daily. No new myalgias/side effects. No CP/DOE.  Lab Results  Component Value Date   CHOL 153 06/21/2022   HDL 47.20 06/21/2022   LDLCALC 74 06/21/2022   TRIG 160.0 (H) 06/21/2022   CHOLHDL 3 06/21/2022   Lab Results  Component Value Date   ALT 17 06/21/2022   AST 18 06/21/2022   ALKPHOS 52 06/21/2022   BILITOT 0.5 06/21/2022   History of GERD Omeprazole occasionally when discussed in 2024. Used every few weeks. Stable. Less need. No diet changes. Rare symptoms. Pepcid otc discussed as option in place of PPI if needed.   Health maintenance Question about COVID-vaccine booster. Discussed options, and CDC booster recommendation.   Erectile dysfunction: Sildenafil as needed - 20mg  - takes 1 and effective.  Denies headache/flushing, blue discoloration of vision, hearing loss  Obesity/prediabetes Weight has increased with less exercise. Spouse at [facility] and on metformin. Would consider taking this helps with weight loss.   Body mass index is 35.32 kg/m.  Wt Readings from Last 3  Encounters:  07/27/23 239 lb 3.2 oz (108.5 kg)  06/21/22 204 lb 12.8 oz (92.9 kg)  04/09/21 186 lb 12.8 oz (84.7 kg)   Lab Results  Component Value Date   HGBA1C 6.0 06/21/2022   History Patient Active Problem List   Diagnosis Date Noted   Diverticular disease of colon 07/27/2023   History of colonic polyps 07/27/2023   Lateral epicondylitis of right elbow 02/27/2018   Adhesive capsulitis of shoulder 02/27/2018   Shoulder joint pain 02/27/2018   Obesity, unspecified 02/06/2013   Hypertension 02/01/2012   Hyperlipidemia 02/01/2012   Reflux 02/01/2012   Abnormal CT scan 02/01/2012   Past Medical History:  Diagnosis Date   Allergy    Hyperlipidemia    Hypertension    Past Surgical History:  Procedure Laterality Date   SHOULDER SURGERY  2013   VASECTOMY     Allergies  Allergen Reactions   Ether Other (See Comments)   Anesthetics, Ester Other (See Comments)    INCREASED BP   Prior to Admission medications   Medication Sig Start Date End Date Taking? Authorizing Provider  Cholecalciferol (VITAMIN D) 400 UNITS capsule Take 1,000 Units by mouth daily.   Yes [provider]  fish oil-omega-3 fatty acids 1000 MG capsule Take 2 g by mouth daily.   Yes [provider]  lisinopril (ZESTRIL) 20 MG tablet Take 1 tablet (20 mg total) by mouth daily. 06/21/22  Yes Neva Seat,  Asencion Partridge, MD  Multiple Vitamin (MULTIVITAMIN) tablet Take 1 tablet by mouth daily.   Yes [provider]  omeprazole (PRILOSEC) 20 MG capsule Take 1 capsule (20 mg total) by mouth daily. 06/21/22  Yes Shade Flood, MD  rosuvastatin (CRESTOR) 20 MG tablet Take 1 tablet (20 mg total) by mouth daily. 06/21/22  Yes Shade Flood, MD  sildenafil (REVATIO) 20 MG tablet Take 0.5-1 tablets (10-20 mg total) by mouth daily as needed. 06/21/22  Yes Shade Flood, MD   Social History   Socioeconomic History   Marital status: Married    Spouse name: Belinda   Number of children: 3    Years of education: Bachelor's   Highest education level: Not on file  Occupational History   Occupation: Conservation officer, nature: [employer]  Tobacco Use   Smoking status: Former    Current packs/day: 0.00    Types: Cigarettes    Start date: 06/01/1975    Quit date: 05/31/1980    Years since quitting: 43.1   Smokeless tobacco: Never  Vaping Use   Vaping status: Never Used  Substance and Sexual Activity   Alcohol use: No   Drug use: No   Sexual activity: Yes  Other Topics Concern   Not on file  Social History Narrative   Lives with wife   Caffeine use: 3-4 diet cokes per day   Married. Education: [employer]. Exercise: No   Social Drivers of Corporate investment banker Strain: Not on file  Food Insecurity: Not on file  Transportation Needs: Not on file  Physical Activity: Not on file  Stress: Not on file  Social Connections: Not on file  Intimate Partner Violence: Not on file    Review of Systems  Constitutional:  Negative for fatigue and unexpected weight change.  Eyes:  Negative for visual disturbance.  Respiratory:  Negative for cough, chest tightness and shortness of breath.   Cardiovascular:  Negative for chest pain, palpitations and leg swelling.  Gastrointestinal:  Negative for abdominal pain and blood in stool.  Neurological:  Negative for dizziness, light-headedness and headaches.     Objective:   Vitals:   07/27/23 0939  BP: 124/78  Pulse: 73  Temp: 98.5 F (36.9 C)  TempSrc: Temporal  SpO2: 99%  Weight: 239 lb 3.2 oz (108.5 kg)  Height: 5\' 9"  (1.753 m)     Physical Exam Vitals reviewed.  Constitutional:      Appearance: He is well-developed.  HENT:     Head: Normocephalic and atraumatic.  Neck:     Vascular: No carotid bruit or JVD.  Cardiovascular:     Rate and Rhythm: Normal rate and regular rhythm.     Heart sounds: Normal heart sounds. No murmur heard. Pulmonary:     Effort: Pulmonary effort is normal.     Breath sounds: Normal breath  sounds. No rales.  Musculoskeletal:     Right lower leg: No edema.     Left lower leg: No edema.  Skin:    General: Skin is warm and dry.  Neurological:     Mental Status: He is alert and oriented to person, place, and time.  Psychiatric:        Mood and Affect: Mood normal.        Assessment & Plan:  Drew Miles is a 63 y.o. male . Prediabetes - Plan: Hemoglobin A1c  -With obesity.  Some weight gain with decreased exercise/activity as above.  Check A1c,  then consider metformin, or possible GLP-1/GIP depending on A1c level and if at diabetes level.  No new meds for now.  Erectile dysfunction, unspecified erectile dysfunction type - Plan: sildenafil (REVATIO) 20 MG tablet  -Stable with low-dose tadalafil, refilled.  Potential side effects and risk discussed.  Essential hypertension - Plan: lisinopril (ZESTRIL) 20 MG tablet  -Tolerating current regimen, continue same dose lisinopril, labs as above.  Gastroesophageal reflux disease, unspecified whether esophagitis present - Plan: omeprazole (PRILOSEC) 20 MG capsule  -Stable with intermittent dosing, option of H2 blocker in place of PPI if mild symptoms and infrequent symptoms.  Hyperlipidemia, unspecified hyperlipidemia type - Plan: rosuvastatin (CRESTOR) 20 MG tablet, Comprehensive metabolic panel, Lipid panel  -Tolerating current dose of Crestor, continue same, check labs and adjust plan accordingly.  Meds ordered this encounter  Medications   sildenafil (REVATIO) 20 MG tablet    Sig: Take 0.5-1 tablets (10-20 mg total) by mouth daily as needed.    Dispense:  10 tablet    Refill:  5   lisinopril (ZESTRIL) 20 MG tablet    Sig: Take 1 tablet (20 mg total) by mouth daily.    Dispense:  90 tablet    Refill:  3   omeprazole (PRILOSEC) 20 MG capsule    Sig: Take 1 capsule (20 mg total) by mouth daily.    Dispense:  90 capsule    Refill:  1    Ok to place on hold.   rosuvastatin (CRESTOR) 20 MG tablet    Sig: Take 1 tablet  (20 mg total) by mouth daily.    Dispense:  90 tablet    Refill:  3   Patient Instructions  I will check labs. Starting metformin may be an option with prediabetes, but would like to see labs first. No changes for now.  Thanks for coming in today.       Signed,   Meredith Staggers, MD [facility], [facility] 07/27/23 10:50 AM

## 2023-07-28 LAB — COMPREHENSIVE METABOLIC PANEL
ALT: 20 U/L (ref 0–53)
AST: 22 U/L (ref 0–37)
Albumin: 4.4 g/dL (ref 3.5–5.2)
Alkaline Phosphatase: 64 U/L (ref 39–117)
BUN: 16 mg/dL (ref 6–23)
CO2: 30 meq/L (ref 19–32)
Calcium: 9.5 mg/dL (ref 8.4–10.5)
Chloride: 102 meq/L (ref 96–112)
Creatinine, Ser: 0.97 mg/dL (ref 0.40–1.50)
GFR: 84.1 mL/min (ref >60.00)
Glucose, Bld: 77 mg/dL (ref 70–99)
Potassium: 4.4 meq/L (ref 3.5–5.1)
Sodium: 140 meq/L (ref 135–145)
Total Bilirubin: 0.4 mg/dL (ref 0.2–1.2)
Total Protein: 7 g/dL (ref 6.0–8.3)

## 2023-07-28 LAB — LIPID PANEL
Cholesterol: 145 mg/dL (ref 0–200)
HDL: 43.5 mg/dL (ref >39.00)
LDL Cholesterol: 69 mg/dL (ref 0–99)
NonHDL: 101.69
Total CHOL/HDL Ratio: 3
Triglycerides: 163 mg/dL — ABNORMAL HIGH (ref 0.0–149.0)
VLDL: 32.6 mg/dL (ref 0.0–40.0)

## 2023-07-28 LAB — HEMOGLOBIN A1C: Hgb A1c MFr Bld: 6.2 % (ref 4.6–6.5)

## 2023-08-02 ENCOUNTER — Encounter: Payer: Self-pay | Admitting: Family Medicine

## 2023-08-02 DIAGNOSIS — R7303 Prediabetes: Secondary | ICD-10-CM

## 2023-08-02 NOTE — Telephone Encounter (Signed)
 Patient would like to try the low dose  Metformin

## 2023-08-02 NOTE — Telephone Encounter (Signed)
 Please see Patients mychart message and advise.

## 2023-08-03 MED ORDER — METFORMIN HCL 500 MG PO TABS: 500.0000 mg | ORAL_TABLET | Freq: Every day | ORAL | 1 refills | Status: DC

## 2023-10-26 ENCOUNTER — Ambulatory Visit (INDEPENDENT_AMBULATORY_CARE_PROVIDER_SITE_OTHER)

## 2023-10-26 ENCOUNTER — Ambulatory Visit: Admitting: Podiatry

## 2023-10-26 DIAGNOSIS — M7751 Other enthesopathy of right foot: Secondary | ICD-10-CM

## 2023-10-26 DIAGNOSIS — M65971 Unspecified synovitis and tenosynovitis, right ankle and foot: Secondary | ICD-10-CM | POA: Diagnosis not present

## 2023-10-26 MED ORDER — MELOXICAM 15 MG PO TABS: 15.0000 mg | ORAL_TABLET | Freq: Every day | ORAL | 1 refills | Status: DC

## 2023-10-26 NOTE — Progress Notes (Signed)
   Chief Complaint  Patient presents with   Foot Pain    RM#& Right foot pain stepped down the wrong way felt something popped a week ago.    HPI: 63 y.o. male presenting today for evaluation of pain and tenderness associated to the right foot.  Onset about 1 week prior to presentation.  He has had pain and tenderness ever since.  There has been some slight improvement since injury.  Past Medical History:  Diagnosis Date   Allergy    Hyperlipidemia    Hypertension     Past Surgical History:  Procedure Laterality Date   SHOULDER SURGERY  2013   VASECTOMY      Allergies  Allergen Reactions   Ether Other (See Comments)   Anesthetics, Ester Other (See Comments)    INCREASED BP     Physical Exam: General: The patient is alert and oriented x3 in no acute distress.  Dermatology: Skin is warm, dry and supple bilateral lower extremities.   Vascular: Palpable pedal pulses bilaterally. Capillary refill within normal limits.  No appreciable edema.  No erythema.  Neurological: Grossly intact via light touch  Musculoskeletal Exam: No pedal deformities noted.  Tenderness to palpation around the second MTP of the right foot extending proximal along the second metatarsal as well  Radiographic Exam RT foot 10/26/2023:  Normal osseous mineralization. Joint spaces preserved.  No fractures or osseous irregularities noted.  Assessment/Plan of Care: 1.  Second MTP capsulitis right.  Possible stress fracture second metatarsal right based on clinical exam  -Patient evaluated.  X-rays reviewed -Prescription for meloxicam  15 mg daily -RICE -Advised against going barefoot.  Recommend good supportive tennis shoes and sneakers -Return to clinic 4 weeks     Dot Gazella, DPM [facility]  Dr. Dot Gazella, DPM    [address] [place], [place] [address]                Office [phone]  Fax [phone]

## 2023-11-23 ENCOUNTER — Ambulatory Visit: Admitting: Podiatry

## 2023-12-12 ENCOUNTER — Ambulatory Visit: Admitting: Podiatry

## 2023-12-17 ENCOUNTER — Other Ambulatory Visit: Payer: Self-pay | Admitting: Podiatry

## 2024-01-18 ENCOUNTER — Other Ambulatory Visit: Payer: Self-pay | Admitting: Family Medicine

## 2024-01-18 DIAGNOSIS — K219 Gastro-esophageal reflux disease without esophagitis: Secondary | ICD-10-CM

## 2024-01-18 DIAGNOSIS — R7303 Prediabetes: Secondary | ICD-10-CM

## 2024-01-18 NOTE — Telephone Encounter (Signed)
 Patient needs appointment. Was supposed to follow up this month. Will call later to schedule

## 2024-02-14 ENCOUNTER — Other Ambulatory Visit: Payer: Self-pay | Admitting: Podiatry

## 2024-02-21 DIAGNOSIS — D171 Benign lipomatous neoplasm of skin and subcutaneous tissue of trunk: Secondary | ICD-10-CM | POA: Diagnosis not present

## 2024-02-21 DIAGNOSIS — D2271 Melanocytic nevi of right lower limb, including hip: Secondary | ICD-10-CM | POA: Diagnosis not present

## 2024-02-21 DIAGNOSIS — D225 Melanocytic nevi of trunk: Secondary | ICD-10-CM | POA: Diagnosis not present

## 2024-02-21 DIAGNOSIS — D2262 Melanocytic nevi of left upper limb, including shoulder: Secondary | ICD-10-CM | POA: Diagnosis not present

## 2024-03-07 ENCOUNTER — Encounter: Payer: Self-pay | Admitting: Family Medicine

## 2024-03-07 ENCOUNTER — Ambulatory Visit (INDEPENDENT_AMBULATORY_CARE_PROVIDER_SITE_OTHER): Admitting: Family Medicine

## 2024-03-07 VITALS — BP 128/80 | HR 82 | Temp 97.8°F | Resp 18 | Ht 69.0 in | Wt 242.6 lb

## 2024-03-07 DIAGNOSIS — E785 Hyperlipidemia, unspecified: Secondary | ICD-10-CM | POA: Diagnosis not present

## 2024-03-07 DIAGNOSIS — R7303 Prediabetes: Secondary | ICD-10-CM

## 2024-03-07 DIAGNOSIS — I10 Essential (primary) hypertension: Secondary | ICD-10-CM

## 2024-03-07 DIAGNOSIS — Z125 Encounter for screening for malignant neoplasm of prostate: Secondary | ICD-10-CM | POA: Diagnosis not present

## 2024-03-07 DIAGNOSIS — G5712 Meralgia paresthetica, left lower limb: Secondary | ICD-10-CM

## 2024-03-07 DIAGNOSIS — Z0000 Encounter for general adult medical examination without abnormal findings: Secondary | ICD-10-CM | POA: Diagnosis not present

## 2024-03-07 DIAGNOSIS — N529 Male erectile dysfunction, unspecified: Secondary | ICD-10-CM

## 2024-03-07 MED ORDER — SILDENAFIL CITRATE 20 MG PO TABS: 10.0000 mg | ORAL_TABLET | Freq: Every day | ORAL | 5 refills | Status: AC | PRN

## 2024-03-07 MED ORDER — ROSUVASTATIN CALCIUM 20 MG PO TABS: 20.0000 mg | ORAL_TABLET | Freq: Every day | ORAL | 3 refills | Status: AC

## 2024-03-07 MED ORDER — LISINOPRIL 20 MG PO TABS: 20.0000 mg | ORAL_TABLET | Freq: Every day | ORAL | 3 refills | Status: AC

## 2024-03-07 MED ORDER — METFORMIN HCL 500 MG PO TABS: 500.0000 mg | ORAL_TABLET | Freq: Every day | ORAL | 1 refills | Status: AC

## 2024-03-07 NOTE — Progress Notes (Signed)
 Subjective:  Patient ID: Drew Miles, male    DOB: 02/01/1961  Age: 63 y.o. MRN: [id number]  CC:  Chief Complaint  Patient presents with   Annual Exam   Numbness    Left thigh numbness. Sx started 1 month ago. No pain. Not constant.     HPI Drew Miles presents for Annual Exam  Both daughter in laws currently expecting - new grandbabies expected soon.  Retired September 30th.   PCP, me Podiatry, Dr. Janit, evaluated in May for synovitis of right foot. Gastroenterology, Dr. Dianna Cardiology, Dr. Sabina, family history of HCM.  Echo in 2022, repeat planned in 3 years given family history of hypertrophic cardiomyopathy based on result note at that time. Derm - Dr. Rolan Molt, [place] [facility]. Screening few weeks ago   L thigh numbness Past month- front of left thigh, not past knee or into back. No new clothing,belts. No leg weakness.   Prediabetes: Diet/exercise approach, with addition of metformin  earlier this year.  500 mg daily. No new side effects.  Lab Results  Component Value Date   HGBA1C 6.2 07/27/2023   Wt Readings from Last 3 Encounters:  03/07/24 242 lb 9.6 oz (110 kg)  07/27/23 239 lb 3.2 oz (108.5 kg)  06/21/22 204 lb 12.8 oz (92.9 kg)   Hypertension: Lisinopril  20 mg daily. Home readings: none recent. Has form to complete for insurance for meter.  BP Readings from Last 3 Encounters:  03/07/24 128/80  07/27/23 124/78  06/21/22 128/80   Lab Results  Component Value Date   CREATININE 0.97 07/27/2023   Hyperlipidemia: Crestor  20 mg daily without any myalgias/side effects.  Denies chest pain or dyspnea with exertion. Lab Results  Component Value Date   CHOL 145 07/27/2023   HDL 43.50 07/27/2023   LDLCALC 69 07/27/2023   TRIG 163.0 (H) 07/27/2023   CHOLHDL 3 07/27/2023   Lab Results  Component Value Date   ALT 20 07/27/2023   AST 22 07/27/2023   ALKPHOS 64 07/27/2023   BILITOT 0.4 07/27/2023   Intermittent omeprazole , H2 blocker discussed for reflux  previously. No recent need. Rare use for heartburn.   Erectile dysfunction Sildenafil  20 mg as needed. No vision/hearing changes, no CP/DOE.          03/07/2024    1:41 PM 07/27/2023    9:41 AM 06/21/2022    3:01 PM 04/09/2021    1:49 PM 09/29/2020    1:58 PM  Depression screen PHQ 2/9  Decreased Interest 0 0 0 0 0  Down, Depressed, Hopeless 0 0 0 0 0  PHQ - 2 Score 0 0 0 0 0  Altered sleeping 0 0 0    Tired, decreased energy 0 0 0    Change in appetite 0 0 0    Feeling bad or failure about yourself  0 0 0    Trouble concentrating 0 0 0    Moving slowly or fidgety/restless 0 0 0    Suicidal thoughts 0 0 0    PHQ-9 Score 0 0 0    Difficult doing work/chores Not difficult at all        Health Maintenance  Topic Date Due   DTaP/Tdap/Td (3 - Td or Tdap) 09/30/2030   Colonoscopy  04/17/2031   Pneumococcal Vaccine: 50+ Years  Completed   Influenza Vaccine  Completed   COVID-19 Vaccine  Completed   Hepatitis C Screening  Completed   HIV Screening  Completed   Zoster Vaccines- Shingrix  Completed   Hepatitis B Vaccines 19-59 Average Risk  Aged Out   HPV VACCINES  Aged Out   Meningococcal B Vaccine  Aged Out  Colonoscopy 2022, repeat 7 years Prostate: does not have family history of prostate cancer The natural history of prostate cancer and ongoing controversy regarding screening and potential treatment outcomes of prostate cancer has been discussed with the patient. The meaning of a false positive PSA and a false negative PSA has been discussed. He indicates understanding of the limitations of this screening test and wishes to proceed with screening PSA testing. Lab Results  Component Value Date   PSA1 0.9 11/10/2017   PSA1 0.8 09/16/2016   PSA 0.55 06/21/2022   PSA 0.28 09/18/2015   PSA 0.30 10/31/2014      Immunization History  Administered Date(s) Administered   Influenza Inj Mdck Quad Pf 02/02/2018, 01/30/2019, 02/03/2022   Influenza Split 02/25/2012   Influenza,  Mdck, Trivalent,PF 6+ MOS(egg free) 02/11/2023, 02/06/2024   Influenza,inj,Quad PF,6+ Mos 02/06/2013, 02/25/2014, 02/16/2015, 02/21/2017, 02/18/2020   MMR 11/10/2017   Moderna Covid-19 Fall Seasonal Vaccine 85yrs & older 02/28/2024   PNEUMOCOCCAL CONJUGATE-20 01/26/2021   Respiratory Syncytial Virus Vaccine,Recomb Aduvanted(Arexvy) 03/03/2022   Tdap 11/11/2009, 09/29/2020   Unspecified SARS-COV-2 Vaccination 03/03/2022   Zoster Recombinant(Shingrix) 11/13/2017, 01/13/2018  Flu and covid vaccines have been given at pharmacy.   No results found. Glasses, optho visit May this year.   Dental: every 4 months.   Alcohol: none.   Tobacco: none  Exercise: waking few days per week.   Body mass index is 35.83 kg/m. Wt Readings from Last 3 Encounters:  03/07/24 242 lb 9.6 oz (110 kg)  07/27/23 239 lb 3.2 oz (108.5 kg)  06/21/22 204 lb 12.8 oz (92.9 kg)      History Patient Active Problem List   Diagnosis Date Noted   Diverticular disease of colon 07/27/2023   History of colonic polyps 07/27/2023   Lateral epicondylitis of right elbow 02/27/2018   Adhesive capsulitis of shoulder 02/27/2018   Shoulder joint pain 02/27/2018   Obesity, unspecified 02/06/2013   Hypertension 02/01/2012   Hyperlipidemia 02/01/2012   Reflux 02/01/2012   Abnormal CT scan 02/01/2012   Past Medical History:  Diagnosis Date   Allergy    GERD (gastroesophageal reflux disease)    Hyperlipidemia    Hypertension    Past Surgical History:  Procedure Laterality Date   SHOULDER SURGERY  2013   VASECTOMY     Allergies  Allergen Reactions   Ether Other (See Comments)   Anesthetics, Ester Other (See Comments)    INCREASED BP   Prior to Admission medications   Medication Sig Start Date End Date Taking? Authorizing Provider  Cholecalciferol (VITAMIN D ) 400 UNITS capsule Take 1,000 Units by mouth daily.   Yes [provider]  fish oil-omega-3 fatty acids 1000 MG capsule Take 2 g by mouth daily.    Yes [provider]  lisinopril  (ZESTRIL ) 20 MG tablet Take 1 tablet (20 mg total) by mouth daily. 07/27/23  Yes Levora Reyes SAUNDERS, MD  metFORMIN  (GLUCOPHAGE ) 500 MG tablet TAKE 1 TABLET BY MOUTH EVERY DAY WITH BREAKFAST 01/18/24  Yes Levora Reyes SAUNDERS, MD  Multiple Vitamin (MULTIVITAMIN) tablet Take 1 tablet by mouth daily.   Yes [provider]  rosuvastatin  (CRESTOR ) 20 MG tablet Take 1 tablet (20 mg total) by mouth daily. 07/27/23  Yes Levora Reyes SAUNDERS, MD  sildenafil  (REVATIO ) 20 MG tablet Take 0.5-1 tablets (10-20 mg total) by mouth  daily as needed. 07/27/23  Yes Levora Reyes SAUNDERS, MD  meloxicam  (MOBIC ) 15 MG tablet TAKE 1 TABLET (15 MG TOTAL) BY MOUTH DAILY. Patient not taking: Reported on 03/07/2024 02/14/24   Janit Thresa HERO, DPM  omeprazole  (PRILOSEC) 20 MG capsule Take 1 capsule (20 mg total) by mouth daily. Patient not taking: Reported on 03/07/2024 07/27/23   Levora Reyes SAUNDERS, MD   Social History   Socioeconomic History   Marital status: Married    Spouse name: Jerelene   Number of children: 3   Years of education: Bachelor's   Highest education level: Bachelor's degree (e.g., BA, AB, BS)  Occupational History   Occupation: Conservation officer, nature: [employer]  Tobacco Use   Smoking status: Former    Current packs/day: 0.00    Types: Cigarettes    Start date: 06/01/1975    Quit date: 05/31/1980    Years since quitting: 43.7   Smokeless tobacco: Never  Vaping Use   Vaping status: Never Used  Substance and Sexual Activity   Alcohol use: No   Drug use: No   Sexual activity: Yes  Other Topics Concern   Not on file  Social History Narrative   Lives with wife   Caffeine use: 3-4 diet cokes per day   Married. Education: [employer]. Exercise: No   Social Drivers of Corporate investment banker Strain: Low Risk  (03/03/2024)   Overall Financial Resource Strain (CARDIA)    Difficulty of Paying Living Expenses: Not very hard  Food Insecurity: No Food Insecurity  (03/03/2024)   Hunger Vital Sign    Worried About Running Out of Food in the Last Year: Never true    Ran Out of Food in the Last Year: Never true  Transportation Needs: No Transportation Needs (03/03/2024)   PRAPARE - Administrator, Civil Service (Medical): No    Lack of Transportation (Non-Medical): No  Physical Activity: Insufficiently Active (03/03/2024)   Exercise Vital Sign    Days of Exercise per Week: 4 days    Minutes of Exercise per Session: 30 min  Stress: No Stress Concern Present (03/03/2024)   [employer] of Occupational Health - Occupational Stress Questionnaire    Feeling of Stress: Not at all  Social Connections: Socially Integrated (03/03/2024)   Social Connection and Isolation Panel    Frequency of Communication with Friends and Family: Three times a week    Frequency of Social Gatherings with Friends and Family: Twice a week    Attends Religious Services: More than 4 times per year    Active Member of [employer] or Organizations: Yes    Attends Engineer, structural: More than 4 times per year    Marital Status: Married  Catering manager Violence: Not on file    Review of Systems 13 point review of systems per patient health survey noted.  Negative other than as indicated above or in HPI.   Objective:   Vitals:   03/07/24 1338  BP: 128/80  Pulse: 82  Resp: 18  Temp: 97.8 F (36.6 C)  TempSrc: Temporal  SpO2: 96%  Weight: 242 lb 9.6 oz (110 kg)  Height: 5' 9 (1.753 m)     Physical Exam Vitals reviewed.  Constitutional:      Appearance: He is well-developed.  HENT:     Head: Normocephalic and atraumatic.     Right Ear: External ear normal.     Left Ear: External ear normal.  Eyes:  Conjunctiva/sclera: Conjunctivae normal.     Pupils: Pupils are equal, round, and reactive to light.  Neck:     Thyroid : No thyromegaly.     Vascular: No carotid bruit or JVD.  Cardiovascular:     Rate and Rhythm: Normal rate and regular  rhythm.     Heart sounds: Normal heart sounds. No murmur heard. Pulmonary:     Effort: Pulmonary effort is normal. No respiratory distress.     Breath sounds: Normal breath sounds. No wheezing or rales.  Abdominal:     General: There is no distension.     Palpations: Abdomen is soft.     Tenderness: There is no abdominal tenderness.  Musculoskeletal:        General: No tenderness. Normal range of motion.     Cervical back: Normal range of motion and neck supple.     Right lower leg: No edema.     Left lower leg: No edema.     Comments: Slight decrease sensation over the anterior mid left thigh, nontender.  Ambulating without assistive device, full, equal strength with left leg extension.  Negative seated straight leg raise  Lymphadenopathy:     Cervical: No cervical adenopathy.  Skin:    General: Skin is warm and dry.  Neurological:     Mental Status: He is alert and oriented to person, place, and time.     Deep Tendon Reflexes: Reflexes are normal and symmetric.  Psychiatric:        Mood and Affect: Mood normal.        Behavior: Behavior normal.        Assessment & Plan:  Drew Miles is a 63 y.o. male . Annual physical exam  - -anticipatory guidance as below in AVS, screening labs above. Health maintenance items as above in HPI discussed/recommended as applicable.   Essential hypertension - Plan: lisinopril  (ZESTRIL ) 20 MG tablet, Comprehensive metabolic panel with GFR  -  Stable, tolerating current regimen. Medications refilled. Labs pending as above.   Prediabetes - Plan: metFORMIN  (GLUCOPHAGE ) 500 MG tablet, Hemoglobin A1c  - Stable, tolerating current regimen. Medications refilled. Labs pending as above.   Hyperlipidemia, unspecified hyperlipidemia type - Plan: rosuvastatin  (CRESTOR ) 20 MG tablet, Comprehensive metabolic panel with GFR, Lipid panel  -  Stable, tolerating current regimen. Medications refilled. Labs pending as above.   Erectile dysfunction, unspecified  erectile dysfunction type - Plan: sildenafil  (REVATIO ) 20 MG tablet  - Rx given - use lowest effective dose. Side effects discussed (including but not limited to headache/flushing, blue discoloration of vision, possible vascular steal and risk of cardiac effects if underlying unknown coronary artery disease, and permanent sensorineural hearing loss). Understanding expressed.  Screening for malignant neoplasm of prostate - Plan: PSA  Lateral femoral cutaneous nerve syndrome  - Typical symptoms based on location.  Avoidance of constrictive clothing, tight belts discussed.  Increased exercise with weight loss should also help improve symptoms.  RTC precautions if persistent, could consider gabapentin but would like to give this some time for conservative treatment as above initially due to potential side effects of that med.  Understanding of plan expressed  Meds ordered this encounter  Medications   lisinopril  (ZESTRIL ) 20 MG tablet    Sig: Take 1 tablet (20 mg total) by mouth daily.    Dispense:  90 tablet    Refill:  3   metFORMIN  (GLUCOPHAGE ) 500 MG tablet    Sig: Take 1 tablet (500 mg total) by mouth daily with breakfast.  Dispense:  90 tablet    Refill:  1   rosuvastatin  (CRESTOR ) 20 MG tablet    Sig: Take 1 tablet (20 mg total) by mouth daily.    Dispense:  90 tablet    Refill:  3   sildenafil  (REVATIO ) 20 MG tablet    Sig: Take 0.5-1 tablets (10-20 mg total) by mouth daily as needed.    Dispense:  10 tablet    Refill:  5   Patient Instructions  Call cardiology about repeat echo timing of echo.   Thank you for coming in today. No change in medications at this time. If there are any concerns on your bloodwork, I will let you know. Take care!  Symptoms on your left thigh are likely due to a condition called lateral femoral cutaneous nerve syndrome or meralgia paresthetica.  That usually causes numbness or tingling across the front of the thigh but no weakness or other deep nerve  symptoms.  It is usually due to compression of the nerve at the waist area.  Try to avoid any constrictive clothing, tight belts, etc.  If not improving in the next few months, recheck or be seen sooner if worsening as we can consider some nerve medications if that persists.    Preventive Care 75-56 Years Old, Male Preventive care refers to lifestyle choices and visits with your health care provider that can promote health and wellness. Preventive care visits are also called wellness exams. What can I expect for my preventive care visit? Counseling During your preventive care visit, your health care provider may ask about your: Medical history, including: Past medical problems. Family medical history. Current health, including: Emotional well-being. Home life and relationship well-being. Sexual activity. Lifestyle, including: Alcohol, nicotine or tobacco, and drug use. Access to firearms. Diet, exercise, and sleep habits. Safety issues such as seatbelt and bike helmet use. Sunscreen use. Work and work Astronomer. Physical exam Your health care provider will check your: Height and weight. These may be used to calculate your BMI (body mass index). BMI is a measurement that tells if you are at a healthy weight. Waist circumference. This measures the distance around your waistline. This measurement also tells if you are at a healthy weight and may help predict your risk of certain diseases, such as type 2 diabetes and high blood pressure. Heart rate and blood pressure. Body temperature. Skin for abnormal spots. What immunizations do I need?  Vaccines are usually given at various ages, according to a schedule. Your health care provider will recommend vaccines for you based on your age, medical history, and lifestyle or other factors, such as travel or where you work. What tests do I need? Screening Your health care provider may recommend screening tests for certain conditions. This may  include: Lipid and cholesterol levels. Diabetes screening. This is done by checking your blood sugar (glucose) after you have not eaten for a while (fasting). Hepatitis B test. Hepatitis C test. HIV (human immunodeficiency virus) test. STI (sexually transmitted infection) testing, if you are at risk. Lung cancer screening. Prostate cancer screening. Colorectal cancer screening. Talk with your health care provider about your test results, treatment options, and if necessary, the need for more tests. Follow these instructions at home: Eating and drinking  Eat a diet that includes fresh fruits and vegetables, whole grains, lean protein, and low-fat dairy products. Take vitamin and mineral supplements as recommended by your health care provider. Do not drink alcohol if your health care provider tells you not  to drink. If you drink alcohol: Limit how much you have to 0-2 drinks a day. Know how much alcohol is in your drink. In the [place] one drink equals one 12 oz bottle of beer (355 mL), one 5 oz glass of wine (148 mL), or one 1 oz glass of hard liquor (44 mL). Lifestyle Brush your teeth every morning and night with fluoride toothpaste. Floss one time each day. Exercise for at least 30 minutes 5 or more days each week. Do not use any products that contain nicotine or tobacco. These products include cigarettes, chewing tobacco, and vaping devices, such as e-cigarettes. If you need help quitting, ask your health care provider. Do not use drugs. If you are sexually active, practice safe sex. Use a condom or other form of protection to prevent STIs. Take aspirin only as told by your health care provider. Make sure that you understand how much to take and what form to take. Work with your health care provider to find out whether it is safe and beneficial for you to take aspirin daily. Find healthy ways to manage stress, such as: Meditation, yoga, or listening to music. Journaling. Talking to a  trusted person. Spending time with friends and family. Minimize exposure to UV radiation to reduce your risk of skin cancer. Safety Always wear your seat belt while driving or riding in a vehicle. Do not drive: If you have been drinking alcohol. Do not ride with someone who has been drinking. When you are tired or distracted. While texting. If you have been using any mind-altering substances or drugs. Wear a helmet and other protective equipment during sports activities. If you have firearms in your house, make sure you follow all gun safety procedures. What's next? Go to your health care provider once a year for an annual wellness visit. Ask your health care provider how often you should have your eyes and teeth checked. Stay up to date on all vaccines. This information is not intended to replace advice given to you by your health care provider. Make sure you discuss any questions you have with your health care provider. Document Revised: 11/12/2020 Document Reviewed: 11/12/2020 Elsevier Patient Education  2024 Elsevier Inc.     Signed,   Reyes Pines, MD [facility], [facility] 03/07/24 2:11 PM

## 2024-03-07 NOTE — Patient Instructions (Addendum)
 Call cardiology about repeat echo timing of echo.   Thank you for coming in today. No change in medications at this time. If there are any concerns on your bloodwork, I will let you know. Take care!  Symptoms on your left thigh are likely due to a condition called lateral femoral cutaneous nerve syndrome or meralgia paresthetica.  That usually causes numbness or tingling across the front of the thigh but no weakness or other deep nerve symptoms.  It is usually due to compression of the nerve at the waist area.  Try to avoid any constrictive clothing, tight belts, etc.  If not improving in the next few months, recheck or be seen sooner if worsening as we can consider some nerve medications if that persists.    Preventive Care 41-82 Years Old, Male Preventive care refers to lifestyle choices and visits with your health care provider that can promote health and wellness. Preventive care visits are also called wellness exams. What can I expect for my preventive care visit? Counseling During your preventive care visit, your health care provider may ask about your: Medical history, including: Past medical problems. Family medical history. Current health, including: Emotional well-being. Home life and relationship well-being. Sexual activity. Lifestyle, including: Alcohol, nicotine or tobacco, and drug use. Access to firearms. Diet, exercise, and sleep habits. Safety issues such as seatbelt and bike helmet use. Sunscreen use. Work and work Astronomer. Physical exam Your health care provider will check your: Height and weight. These may be used to calculate your BMI (body mass index). BMI is a measurement that tells if you are at a healthy weight. Waist circumference. This measures the distance around your waistline. This measurement also tells if you are at a healthy weight and may help predict your risk of certain diseases, such as type 2 diabetes and high blood pressure. Heart rate and  blood pressure. Body temperature. Skin for abnormal spots. What immunizations do I need?  Vaccines are usually given at various ages, according to a schedule. Your health care provider will recommend vaccines for you based on your age, medical history, and lifestyle or other factors, such as travel or where you work. What tests do I need? Screening Your health care provider may recommend screening tests for certain conditions. This may include: Lipid and cholesterol levels. Diabetes screening. This is done by checking your blood sugar (glucose) after you have not eaten for a while (fasting). Hepatitis B test. Hepatitis C test. HIV (human immunodeficiency virus) test. STI (sexually transmitted infection) testing, if you are at risk. Lung cancer screening. Prostate cancer screening. Colorectal cancer screening. Talk with your health care provider about your test results, treatment options, and if necessary, the need for more tests. Follow these instructions at home: Eating and drinking  Eat a diet that includes fresh fruits and vegetables, whole grains, lean protein, and low-fat dairy products. Take vitamin and mineral supplements as recommended by your health care provider. Do not drink alcohol if your health care provider tells you not to drink. If you drink alcohol: Limit how much you have to 0-2 drinks a day. Know how much alcohol is in your drink. In the [place] one drink equals one 12 oz bottle of beer (355 mL), one 5 oz glass of wine (148 mL), or one 1 oz glass of hard liquor (44 mL). Lifestyle Brush your teeth every morning and night with fluoride toothpaste. Floss one time each day. Exercise for at least 30 minutes 5 or more days each week.  Do not use any products that contain nicotine or tobacco. These products include cigarettes, chewing tobacco, and vaping devices, such as e-cigarettes. If you need help quitting, ask your health care provider. Do not use drugs. If you are  sexually active, practice safe sex. Use a condom or other form of protection to prevent STIs. Take aspirin only as told by your health care provider. Make sure that you understand how much to take and what form to take. Work with your health care provider to find out whether it is safe and beneficial for you to take aspirin daily. Find healthy ways to manage stress, such as: Meditation, yoga, or listening to music. Journaling. Talking to a trusted person. Spending time with friends and family. Minimize exposure to UV radiation to reduce your risk of skin cancer. Safety Always wear your seat belt while driving or riding in a vehicle. Do not drive: If you have been drinking alcohol. Do not ride with someone who has been drinking. When you are tired or distracted. While texting. If you have been using any mind-altering substances or drugs. Wear a helmet and other protective equipment during sports activities. If you have firearms in your house, make sure you follow all gun safety procedures. What's next? Go to your health care provider once a year for an annual wellness visit. Ask your health care provider how often you should have your eyes and teeth checked. Stay up to date on all vaccines. This information is not intended to replace advice given to you by your health care provider. Make sure you discuss any questions you have with your health care provider. Document Revised: 11/12/2020 Document Reviewed: 11/12/2020 Elsevier Patient Education  2024 [employer].

## 2024-03-08 DIAGNOSIS — Z1231 Encounter for screening mammogram for malignant neoplasm of breast: Secondary | ICD-10-CM | POA: Diagnosis not present

## 2024-03-08 LAB — LIPID PANEL
Cholesterol: 142 mg/dL (ref 0–200)
HDL: 36.9 mg/dL — ABNORMAL LOW (ref >39.00)
LDL Cholesterol: 68 mg/dL (ref 0–99)
NonHDL: 104.93
Total CHOL/HDL Ratio: 4
Triglycerides: 185 mg/dL — ABNORMAL HIGH (ref 0.0–149.0)
VLDL: 37 mg/dL (ref 0.0–40.0)

## 2024-03-08 LAB — COMPREHENSIVE METABOLIC PANEL WITH GFR
ALT: 19 U/L (ref 0–53)
AST: 20 U/L (ref 0–37)
Albumin: 4.6 g/dL (ref 3.5–5.2)
Alkaline Phosphatase: 63 U/L (ref 39–117)
BUN: 14 mg/dL (ref 6–23)
CO2: 30 meq/L (ref 19–32)
Calcium: 9.2 mg/dL (ref 8.4–10.5)
Chloride: 102 meq/L (ref 96–112)
Creatinine, Ser: 0.93 mg/dL (ref 0.40–1.50)
GFR: 88.08 mL/min (ref >60.00)
Glucose, Bld: 86 mg/dL (ref 70–99)
Potassium: 3.9 meq/L (ref 3.5–5.1)
Sodium: 141 meq/L (ref 135–145)
Total Bilirubin: 0.5 mg/dL (ref 0.2–1.2)
Total Protein: 6.7 g/dL (ref 6.0–8.3)

## 2024-03-08 LAB — HEMOGLOBIN A1C: Hgb A1c MFr Bld: 6.1 % (ref 4.6–6.5)

## 2024-03-08 LAB — PSA: PSA: 0.84 ng/mL (ref 0.10–4.00)

## 2024-03-14 ENCOUNTER — Ambulatory Visit: Payer: Self-pay | Admitting: Family Medicine

## 2024-04-16 ENCOUNTER — Other Ambulatory Visit: Payer: Self-pay | Admitting: Podiatry

## 2024-04-20 DIAGNOSIS — R361 Hematospermia: Secondary | ICD-10-CM | POA: Diagnosis not present

## 2024-04-20 DIAGNOSIS — R3129 Other microscopic hematuria: Secondary | ICD-10-CM | POA: Diagnosis not present

## 2024-04-23 DIAGNOSIS — M47816 Spondylosis without myelopathy or radiculopathy, lumbar region: Secondary | ICD-10-CM | POA: Diagnosis not present

## 2024-04-23 DIAGNOSIS — M545 Low back pain, unspecified: Secondary | ICD-10-CM | POA: Diagnosis not present

## 2024-04-25 DIAGNOSIS — R3129 Other microscopic hematuria: Secondary | ICD-10-CM | POA: Diagnosis not present

## 2024-05-10 DIAGNOSIS — N281 Cyst of kidney, acquired: Secondary | ICD-10-CM | POA: Diagnosis not present

## 2024-05-10 DIAGNOSIS — R3129 Other microscopic hematuria: Secondary | ICD-10-CM | POA: Diagnosis not present

## 2024-05-28 ENCOUNTER — Encounter: Payer: Self-pay | Admitting: Family Medicine

## 2024-05-28 NOTE — Telephone Encounter (Signed)
 Does patient need anymore vaccines? [facility] has what we have on file for patient

## 2024-05-29 NOTE — Telephone Encounter (Signed)
 Immunization History  Administered Date(s) Administered   Influenza Inj Mdck Quad Pf 02/02/2018, 01/30/2019, 02/03/2022   Influenza Split 02/25/2012   Influenza, Mdck, Trivalent,PF 6+ MOS(egg free) 02/11/2023, 02/06/2024   Influenza,inj,Quad PF,6+ Mos 02/06/2013, 02/25/2014, 02/16/2015, 02/21/2017, 02/18/2020   MMR 11/10/2017   Moderna Covid-19 Fall Seasonal Vaccine 27yrs & older 02/28/2024   PNEUMOCOCCAL CONJUGATE-20 01/26/2021   Respiratory Syncytial Virus Vaccine,Recomb Aduvanted(Arexvy) 03/03/2022   Tdap 11/11/2009, 09/29/2020   Unspecified SARS-COV-2 Vaccination 03/03/2022   Zoster Recombinant(Shingrix) 11/13/2017, 01/13/2018   Looks like he had a MMR booster in 2019.  He is up-to-date on pneumonia vaccine, Tdap, flu vaccine and COVID booster.  He is up-to-date on shingles vaccine.  I do not see any need for additional vaccines at this time.

## 2024-06-18 ENCOUNTER — Other Ambulatory Visit: Payer: Self-pay | Admitting: Podiatry

## 2024-09-05 ENCOUNTER — Ambulatory Visit: Admitting: Family Medicine
